# Patient Record
Sex: Male | Born: 1950 | Race: White | Hispanic: No | Marital: Married | Smoking: Never smoker
Health system: Southern US, Community
[De-identification: ages and names within clinical notes are randomized; demographics above are authoritative.]

## PROBLEM LIST (undated history)

## (undated) DIAGNOSIS — I10 Essential (primary) hypertension: Secondary | ICD-10-CM

## (undated) DIAGNOSIS — G4730 Sleep apnea, unspecified: Secondary | ICD-10-CM

## (undated) DIAGNOSIS — M1990 Unspecified osteoarthritis, unspecified site: Secondary | ICD-10-CM

## (undated) DIAGNOSIS — H269 Unspecified cataract: Secondary | ICD-10-CM

## (undated) DIAGNOSIS — S060X9A Concussion with loss of consciousness of unspecified duration, initial encounter: Secondary | ICD-10-CM

## (undated) DIAGNOSIS — Z9289 Personal history of other medical treatment: Secondary | ICD-10-CM

## (undated) DIAGNOSIS — E119 Type 2 diabetes mellitus without complications: Secondary | ICD-10-CM

## (undated) DIAGNOSIS — J189 Pneumonia, unspecified organism: Secondary | ICD-10-CM

## (undated) DIAGNOSIS — J45909 Unspecified asthma, uncomplicated: Secondary | ICD-10-CM

## (undated) HISTORY — PX: TONSILLECTOMY: SUR1361

## (undated) HISTORY — DX: Unspecified cataract: H26.9

## (undated) HISTORY — PX: TESTICLE REMOVAL: SHX68

## (undated) HISTORY — PX: HERNIA REPAIR: SHX51

## (undated) HISTORY — PX: EYE SURGERY: SHX253

## (undated) HISTORY — PX: IRRIGATION AND DEBRIDEMENT SEBACEOUS CYST: SHX5255

---
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
## 2004-12-07 HISTORY — PX: GASTRIC BYPASS: SHX52

## 2013-05-09 DIAGNOSIS — S060X9A Concussion with loss of consciousness of unspecified duration, initial encounter: Secondary | ICD-10-CM

## 2013-05-09 DIAGNOSIS — S060XAA Concussion with loss of consciousness status unknown, initial encounter: Secondary | ICD-10-CM

## 2013-05-09 HISTORY — DX: Concussion with loss of consciousness of unspecified duration, initial encounter: S06.0X9A

## 2013-05-09 HISTORY — DX: Concussion with loss of consciousness status unknown, initial encounter: S06.0XAA

## 2014-05-09 DIAGNOSIS — G4730 Sleep apnea, unspecified: Secondary | ICD-10-CM

## 2014-05-09 DIAGNOSIS — E119 Type 2 diabetes mellitus without complications: Secondary | ICD-10-CM

## 2014-05-09 DIAGNOSIS — Z9289 Personal history of other medical treatment: Secondary | ICD-10-CM

## 2014-05-09 HISTORY — DX: Sleep apnea, unspecified: G47.30

## 2014-05-09 HISTORY — DX: Type 2 diabetes mellitus without complications: E11.9

## 2014-05-09 HISTORY — DX: Personal history of other medical treatment: Z92.89

## 2015-08-13 ENCOUNTER — Other Ambulatory Visit: Payer: Self-pay | Admitting: Ophthalmology

## 2015-08-18 NOTE — H&P (Signed)
History & Physical:   DATE:   08-13-2015  NAME:  Aaron Shah, Aaron Shah     BQ:[id number]       HISTORY OF PRESENT ILLNESS: Chief Eye Complaints blurry vision diabetic eye exam  HA1C 6.8. BS 108mg % yesterday. Patient c/o vision being blurry. Patient c/o having problems with glare with cars approaching.  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        LOCATION:   BOTH EYES        QUALITY/COURSE:   Reports condition is worsening.        INTENSITY/SEVERITY:    Reports measurement ( or degree) as mild.      DURATION:   Reports the general length of symptoms to be months.      ONSET/TIMING:   Reports occurrence as   CONTEXT/WHEN:   Reports usually associated with   MODIFIERS/TREATMENTS:  Improved by              ACTIVE PROBLEMS: Exotropia   ICD10: H50.10 ICD9: 378.1 Onset: 08/10/2015 10:11 Initial Date:    Age-related nuclear cataract, bilateral   ICD10: H25.13  ICD9:  SURGERIES: Pick List - Surgeries  MEDICATIONS: Metformin (Glucophage):   500 mg tablet  SIG-  1 each    2 times a day    Lisinopril (Prinivil, Zestril):    20 mg tablet    SIG-   1 each     once a day              Gabapentin: 300 mg capsule SIG-  Dose-  Freq-    Pred Forte: acetate 1% suspension SIG-  1 DROP IN OS QID   Acular (Ketorlac Tromethamine):   0.5% solution  SIG-  1 drop in OS BID   Zymaxid: 0.5% solution SIG-  1 drop in OS BID  REVIEW OF SYSTEMS: ROS:   GEN- Constitutional: HENT: GEN - Endocrine: Reports symptoms of diabetes.    LUNGS/Respiratory:  HEART/Cardiovascular: Reports symptoms of hypertension.    ABD/Gastrointestinal:  Musculoskeletal (BJE): NEURO/Neurological: PSYCH/Psychiatric:    Is the pt oriented to time, place, person? yes Mood depressed __ normal  agitated __    TOBACCO: Smoker Status:   Never smoker   ICD10: Z87.898 ICD9: V13.89 Onset: 08/10/2015 10:05 Initial Date:  SOCIAL  HISTORY: Starter Pick List - Social History  FAMILY HISTORY: Family History - 1st Degree Relatives:  Brother alive and well.  ALLERGIES: Drug Allergies.  No Known.   Starter - Allergies - Summary:  PHYSICAL EXAMINATION: VS: BMI: 38.1.  BP: 110/68.  H: 70.00 in.  P: 66 /min.  W: 265lbs 0oz.    Va  08/10/2015 10:07    OD cc 20/40 ph NI OS cc 20/70 ph NI   EYEGLASSES:  OD: +0.75 sph OS: plano +1.25 x089  ADD: +2.50  MR  08/10/2015 10:12  OD:  +0.75 sph 20/40 OS: +0.25 +1.50 x090 20/70 ADD: +2.50  K's OD:    43.50     axis 128    44.25      axis 038 OS:    44.00     axis 030    44.75      axis 120  VF:  full in all four quadrants  Motility XT on up gaze OS   PUPILS: 69mm reactive -mg  EYELIDS & OCULAR ADNEXA    SLE: Conjunctiva  Cornea arcus, old scar OS     anterior chamber   deep and quiet    Iris brown      Lens OD anterior cortical and psc .   1-2  nuclear  sclerosis  and +3 psc OS   Vitreous posterior vitreous detachment   OU  CCT  Ta   in mmHg    OD 14           OS   14 Time  08/10/2015 10:20   Gonio   Dilation    phenylephrine 2.5% and trop 1% 08/10/2015 10:24   Fundus:  optic nerve  OD  45% cup                                                                   OS  45% cup   Macula      OD    clear                                                OS clear  Vessels  normal    Periphery  normal  ,  difficult view OS       Exam: GENERAL: Appearance: HEAD, EARS, NOSE AND THROAT: Ears-Nose (external) Inspection: Externally, nose and ears are normal in appearance and without scars, lesions, or nodules.      Hearing assessment shows no problems with normal conversation.      LUNGS and RESPIRATORY: Lung auscultation elicits no wheezing, rhonci, rales or rubs and with equal breath sounds.    Respiratory effort described as breathing is unlabored and chest movement is symmetrical.    HEART (Cardiovascular): Heart auscultation discovers regular  rate and rhythm; no murmur, gallop or rub. Normal heart sounds.    ABDOMEN (Gastrointestinal): Mass/Tenderness Exam: Neither are present.     MUSCULOSKELETAL (BJE): Inspection-Palpation: No major bone, joint, tendon, or muscle changes.      NEUROLOGICAL: Alert and oriented. No major deficits of coordination or sensation.      PSYCHIATRIC: Insight and judgment appear  both to be intact and appropriate.    Mood and affect are described as normal mood and full affect.    SKIN: Skin Inspection: No rashes or lesions  ADMITTING DIAGNOSIS: Age-related nuclear cataract, bilateral   ICD10: H25.13 Exotropia   ICD10: H50.10 ICD9: 378.1 Onset: 08/10/2015 10:11 Initial Date:      SURGICAL TREATMENT PLAN: APRI L 11, 2017 START KETORLAC (GREY TOP) IN THE LEFT EYE ONCE AT BREAKFAST AND ONCE AT SUPER  START ZYMAXID (TAN TOP) IN THE LEFT EYE ONCE AT BREAKFAST AND ONCE AT BEDTIME  PLEASE WAIT 5-10 MINUTES IN BETWEEN DROPS   phaco emulsion cataract extraction  w  intraocular lens implant  OS  Risk and benefits of surgery have been reviewed with the patient and the patient agrees to proceed with the surgical procedure.       Actions:     Handouts: New Handout, What is a cataract?.    ___________________________ Marylynn Pearson, Jr. concussion 21/2 yrs ago glass in OS yrs ago Starter -  Inactive Problems:

## 2015-08-18 NOTE — Progress Notes (Signed)
I was unable to reach patient by phone.  I left  A message on voice mail.  I instructed the patient to arrive at [facility] entrance at 9:15 , nothing to eat or drink after midnight.   I instructed the patient to not take any medications in am. I asked patient to not wear any lotions, powders, cologne, jewelry, piercing, make-up or nail polish.  I asked the patient to call [phone], in the am if there were any questions or problems.  I instructed patient to check CBG to check CBG and if it is less than 70 to treat it with Glucose Gel, Glucose tablets or 1/2 cup of clear juice like apple juice or cranberry juice, or 1/2 cup of regular soda. (not cream soda). I instructed patient to recheck CBG in 15 minutes and if CBG is not greater than 70, to  Call [phone] (pre- op). If it is before pre-op opens to retreat as before and recheck CBG in 15 minutes. I told patient to make note of time that liquid is taken and amount, that surgical time may have to be adjusted.

## 2015-08-19 ENCOUNTER — Encounter (HOSPITAL_COMMUNITY)
Admission: RE | Disposition: A | Discharge status: Home / Self Care | Payer: Self-pay | Source: Ambulatory Visit | Attending: Ophthalmology

## 2015-08-19 ENCOUNTER — Ambulatory Visit (HOSPITAL_COMMUNITY): Payer: No Typology Code available for payment source | Admitting: Anesthesiology

## 2015-08-19 ENCOUNTER — Encounter (HOSPITAL_COMMUNITY): Payer: Self-pay

## 2015-08-19 ENCOUNTER — Ambulatory Visit (HOSPITAL_COMMUNITY)
Admission: RE | Admit: 2015-08-19 | Discharge: 2015-08-19 | Disposition: A | Discharge status: Home / Self Care | Payer: No Typology Code available for payment source | Source: Ambulatory Visit | Attending: Ophthalmology | Admitting: Ophthalmology

## 2015-08-19 DIAGNOSIS — I10 Essential (primary) hypertension: Secondary | ICD-10-CM | POA: Insufficient documentation

## 2015-08-19 DIAGNOSIS — H5010 Unspecified exotropia: Secondary | ICD-10-CM | POA: Diagnosis not present

## 2015-08-19 DIAGNOSIS — Z7984 Long term (current) use of oral hypoglycemic drugs: Secondary | ICD-10-CM | POA: Diagnosis not present

## 2015-08-19 DIAGNOSIS — E119 Type 2 diabetes mellitus without complications: Secondary | ICD-10-CM | POA: Insufficient documentation

## 2015-08-19 DIAGNOSIS — H269 Unspecified cataract: Secondary | ICD-10-CM | POA: Diagnosis present

## 2015-08-19 DIAGNOSIS — Z79899 Other long term (current) drug therapy: Secondary | ICD-10-CM | POA: Diagnosis not present

## 2015-08-19 DIAGNOSIS — H2513 Age-related nuclear cataract, bilateral: Secondary | ICD-10-CM | POA: Diagnosis not present

## 2015-08-19 HISTORY — DX: Essential (primary) hypertension: I10

## 2015-08-19 HISTORY — DX: Type 2 diabetes mellitus without complications: E11.9

## 2015-08-19 HISTORY — DX: Pneumonia, unspecified organism: J18.9

## 2015-08-19 HISTORY — PX: CATARACT EXTRACTION W/PHACO: SHX586

## 2015-08-19 HISTORY — DX: Unspecified osteoarthritis, unspecified site: M19.90

## 2015-08-19 HISTORY — DX: Unspecified asthma, uncomplicated: J45.909

## 2015-08-19 HISTORY — DX: Personal history of other medical treatment: Z92.89

## 2015-08-19 HISTORY — DX: Concussion with loss of consciousness of unspecified duration, initial encounter: S06.0X9A

## 2015-08-19 HISTORY — DX: Sleep apnea, unspecified: G47.30

## 2015-08-19 LAB — GLUCOSE, CAPILLARY
GLUCOSE-CAPILLARY: 127 mg/dL — AB (ref 65–99)
Glucose-Capillary: 113 mg/dL — ABNORMAL HIGH (ref 65–99)

## 2015-08-19 LAB — CBC
HCT: 41.8 % (ref 39.0–52.0)
HEMOGLOBIN: 13.8 g/dL (ref 13.0–17.0)
MCH: 28.4 pg (ref 26.0–34.0)
MCHC: 33 g/dL (ref 30.0–36.0)
MCV: 86 fL (ref 78.0–100.0)
Platelets: 250 10*3/uL (ref 150–400)
RBC: 4.86 MIL/uL (ref 4.22–5.81)
RDW: 15.2 % (ref 11.5–15.5)
WBC: 8.3 10*3/uL (ref 4.0–10.5)

## 2015-08-19 LAB — BASIC METABOLIC PANEL
Anion gap: 10 (ref 5–15)
BUN: 18 mg/dL (ref 6–20)
CHLORIDE: 109 mmol/L (ref 101–111)
CO2: 21 mmol/L — AB (ref 22–32)
CREATININE: 1.09 mg/dL (ref 0.61–1.24)
Calcium: 8.9 mg/dL (ref 8.9–10.3)
GFR calc Af Amer: >60 mL/min (ref >60)
GFR calc non Af Amer: >60 mL/min (ref >60)
GLUCOSE: 141 mg/dL — AB (ref 65–99)
Potassium: 4.1 mmol/L (ref 3.5–5.1)
SODIUM: 140 mmol/L (ref 135–145)

## 2015-08-19 SURGERY — PHACOEMULSIFICATION, CATARACT, WITH IOL INSERTION
Anesthesia: Monitor Anesthesia Care | Site: Eye | Laterality: Left

## 2015-08-19 MED ORDER — GENTAMICIN SULFATE 40 MG/ML IJ SOLN
INTRAMUSCULAR | Status: AC
  Filled 2015-08-19: qty 2

## 2015-08-19 MED ORDER — PHENYLEPHRINE HCL 2.5 % OP SOLN
1.0000 [drp] | OPHTHALMIC | Status: AC
  Administered 2015-08-19 (×3): 1 [drp] via OPHTHALMIC
  Filled 2015-08-19: qty 2

## 2015-08-19 MED ORDER — MIDAZOLAM HCL 5 MG/5ML IJ SOLN
INTRAMUSCULAR | Status: DC | PRN
  Administered 2015-08-19: 2 mg via INTRAVENOUS

## 2015-08-19 MED ORDER — EPINEPHRINE HCL 1 MG/ML IJ SOLN
INTRAMUSCULAR | Status: AC
  Filled 2015-08-19: qty 1

## 2015-08-19 MED ORDER — BSS IO SOLN
INTRAOCULAR | Status: AC
  Filled 2015-08-19: qty 500

## 2015-08-19 MED ORDER — SODIUM HYALURONATE 10 MG/ML IO SOLN
INTRAOCULAR | Status: DC | PRN
  Administered 2015-08-19: 0.85 mL via INTRAOCULAR

## 2015-08-19 MED ORDER — GATIFLOXACIN 0.5 % OP SOLN
1.0000 [drp] | OPHTHALMIC | Status: AC
  Administered 2015-08-19 (×3): 1 [drp] via OPHTHALMIC
  Filled 2015-08-19: qty 2.5

## 2015-08-19 MED ORDER — PROPOFOL 10 MG/ML IV BOLUS
INTRAVENOUS | Status: DC | PRN
  Administered 2015-08-19: 100 mg via INTRAVENOUS

## 2015-08-19 MED ORDER — LIDOCAINE HCL 2 % IJ SOLN
INTRAMUSCULAR | Status: DC | PRN
  Administered 2015-08-19: 5 mL via RETROBULBAR

## 2015-08-19 MED ORDER — PROPOFOL 10 MG/ML IV BOLUS
INTRAVENOUS | Status: AC
  Filled 2015-08-19: qty 20

## 2015-08-19 MED ORDER — NA CHONDROIT SULF-NA HYALURON 40-30 MG/ML IO SOLN
INTRAOCULAR | Status: DC | PRN
  Administered 2015-08-19: 1 mL via INTRAOCULAR

## 2015-08-19 MED ORDER — MIDAZOLAM HCL 2 MG/2ML IJ SOLN
INTRAMUSCULAR | Status: AC
  Filled 2015-08-19: qty 2

## 2015-08-19 MED ORDER — BSS IO SOLN
INTRAOCULAR | Status: DC | PRN
  Administered 2015-08-19: .3 mL

## 2015-08-19 MED ORDER — TETRACAINE HCL 0.5 % OP SOLN
1.0000 [drp] | OPHTHALMIC | Status: DC
  Filled 2015-08-19: qty 2

## 2015-08-19 MED ORDER — ACETAMINOPHEN 325 MG PO TABS
ORAL_TABLET | ORAL | Status: AC
  Filled 2015-08-19: qty 2

## 2015-08-19 MED ORDER — SODIUM CHLORIDE 0.9 % IV SOLN
INTRAVENOUS | Status: DC
  Administered 2015-08-19: 11:00:00 via INTRAVENOUS

## 2015-08-19 MED ORDER — TOBRAMYCIN-DEXAMETHASONE 0.3-0.1 % OP OINT
TOPICAL_OINTMENT | OPHTHALMIC | Status: AC
  Filled 2015-08-19: qty 3.5

## 2015-08-19 MED ORDER — LIDOCAINE HCL (CARDIAC) 20 MG/ML IV SOLN
INTRAVENOUS | Status: AC
  Filled 2015-08-19: qty 5

## 2015-08-19 MED ORDER — SODIUM HYALURONATE 10 MG/ML IO SOLN
INTRAOCULAR | Status: AC
  Filled 2015-08-19: qty 0.85

## 2015-08-19 MED ORDER — 0.9 % SODIUM CHLORIDE (POUR BTL) OPTIME
TOPICAL | Status: DC | PRN
  Administered 2015-08-19: 1000 mL

## 2015-08-19 MED ORDER — SODIUM CHLORIDE 0.9 % IV SOLN
INTRAVENOUS | Status: DC | PRN
  Administered 2015-08-19: 11:00:00 via INTRAVENOUS

## 2015-08-19 MED ORDER — ACETYLCHOLINE CHLORIDE 20 MG IO SOLR
INTRAOCULAR | Status: DC | PRN
  Administered 2015-08-19: 5 mg via INTRAOCULAR

## 2015-08-19 MED ORDER — LIDOCAINE-EPINEPHRINE 2 %-1:100000 IJ SOLN
INTRAMUSCULAR | Status: DC | PRN
  Administered 2015-08-19: 5 mL via RETROBULBAR

## 2015-08-19 MED ORDER — NA CHONDROIT SULF-NA HYALURON 40-30 MG/ML IO SOLN
INTRAOCULAR | Status: AC
  Filled 2015-08-19: qty 0.5

## 2015-08-19 MED ORDER — LIDOCAINE-EPINEPHRINE 2 %-1:100000 IJ SOLN
INTRAMUSCULAR | Status: AC
  Filled 2015-08-19: qty 1

## 2015-08-19 MED ORDER — KETOROLAC TROMETHAMINE 0.5 % OP SOLN
1.0000 [drp] | OPHTHALMIC | Status: AC
  Administered 2015-08-19 (×3): 1 [drp] via OPHTHALMIC
  Filled 2015-08-19: qty 5

## 2015-08-19 MED ORDER — LIDOCAINE HCL (CARDIAC) 20 MG/ML IV SOLN
INTRAVENOUS | Status: DC | PRN
  Administered 2015-08-19: 60 mg via INTRAVENOUS

## 2015-08-19 MED ORDER — TROPICAMIDE 1 % OP SOLN
1.0000 [drp] | OPHTHALMIC | Status: AC
  Administered 2015-08-19 (×3): 1 [drp] via OPHTHALMIC
  Filled 2015-08-19: qty 3

## 2015-08-19 MED ORDER — EPINEPHRINE HCL 1 MG/ML IJ SOLN
INTRAOCULAR | Status: DC | PRN
  Administered 2015-08-19: .3 mL

## 2015-08-19 MED ORDER — CYCLOPENTOLATE HCL 1 % OP SOLN
1.0000 [drp] | OPHTHALMIC | Status: AC
  Administered 2015-08-19 (×3): 1 [drp] via OPHTHALMIC
  Filled 2015-08-19: qty 2

## 2015-08-19 MED ORDER — ACETYLCHOLINE CHLORIDE 20 MG IO SOLR
INTRAOCULAR | Status: AC
  Filled 2015-08-19: qty 1

## 2015-08-19 MED ORDER — DEXAMETHASONE SODIUM PHOSPHATE 10 MG/ML IJ SOLN
INTRAMUSCULAR | Status: AC
  Filled 2015-08-19: qty 1

## 2015-08-19 MED ORDER — TOBRAMYCIN 0.3 % OP OINT
TOPICAL_OINTMENT | OPHTHALMIC | Status: DC | PRN
  Administered 2015-08-19: 1 via OPHTHALMIC

## 2015-08-19 MED ORDER — ACETAMINOPHEN 325 MG PO TABS
650.0000 mg | ORAL_TABLET | ORAL | Status: DC | PRN
  Administered 2015-08-19: 650 mg via ORAL

## 2015-08-19 MED ORDER — PILOCARPINE HCL 4 % OP SOLN
OPHTHALMIC | Status: AC
  Filled 2015-08-19: qty 15

## 2015-08-19 MED ORDER — LIDOCAINE HCL (PF) 0.5 % IJ SOLN
INTRAMUSCULAR | Status: DC | PRN
  Administered 2015-08-19: 1 mL

## 2015-08-19 MED ORDER — TETRACAINE HCL 0.5 % OP SOLN
OPHTHALMIC | Status: AC
  Filled 2015-08-19: qty 2

## 2015-08-19 MED ORDER — BUPIVACAINE HCL (PF) 0.75 % IJ SOLN
INTRAMUSCULAR | Status: AC
  Filled 2015-08-19: qty 10

## 2015-08-19 MED ORDER — LIDOCAINE HCL 2 % IJ SOLN
INTRAMUSCULAR | Status: AC
  Filled 2015-08-19: qty 20

## 2015-08-19 MED ORDER — BSS IO SOLN
INTRAOCULAR | Status: AC
  Filled 2015-08-19: qty 15

## 2015-08-19 SURGICAL SUPPLY — 32 items
APPLICATOR COTTON TIP 6IN STRL (MISCELLANEOUS) ×2 IMPLANT
APPLICATOR DR MATTHEWS STRL (MISCELLANEOUS) ×2 IMPLANT
BLADE KERATOME 2.75 (BLADE) ×2 IMPLANT
CANNULA ANTERIOR CHAMBER 27GA (MISCELLANEOUS) ×2 IMPLANT
CORDS BIPOLAR (ELECTRODE) IMPLANT
COVER MAYO STAND STRL (DRAPES) ×2 IMPLANT
DRAPE OPHTHALMIC 40X48 W POUCH (DRAPES) ×2 IMPLANT
DRAPE RETRACTOR (MISCELLANEOUS) ×2 IMPLANT
GLOVE BIO SURGEON STRL SZ8 (GLOVE) ×2 IMPLANT
GLOVE SS BIOGEL STRL SZ 8 (GLOVE) ×1 IMPLANT
GLOVE SUPERSENSE BIOGEL SZ 8 (GLOVE) ×1
GOWN STRL REUS W/ TWL LRG LVL3 (GOWN DISPOSABLE) ×2 IMPLANT
GOWN STRL REUS W/TWL LRG LVL3 (GOWN DISPOSABLE) ×2
KIT BASIN OR (CUSTOM PROCEDURE TRAY) ×2 IMPLANT
KIT ROOM TURNOVER OR (KITS) ×2 IMPLANT
LENS IOL ACRSF IQ PC 20.5 (Intraocular Lens) ×1 IMPLANT
LENS IOL ACRYSOF IQ POST 20.5 (Intraocular Lens) ×2 IMPLANT
NEEDLE 18GX1X1/2 (RX/OR ONLY) (NEEDLE) ×2 IMPLANT
NEEDLE 25GX 5/8IN NON SAFETY (NEEDLE) ×2 IMPLANT
NEEDLE FILTER BLUNT 18X 1/2SAF (NEEDLE) ×1
NEEDLE FILTER BLUNT 18X1 1/2 (NEEDLE) ×1 IMPLANT
NS IRRIG 1000ML POUR BTL (IV SOLUTION) ×4 IMPLANT
PACK CATARACT CUSTOM (CUSTOM PROCEDURE TRAY) ×2 IMPLANT
PAD ARMBOARD 7.5X6 YLW CONV (MISCELLANEOUS) ×2 IMPLANT
PAK PIK CVS CATARACT (OPHTHALMIC) ×2 IMPLANT
SUT ETHILON 10 0 CS140 6 (SUTURE) ×2 IMPLANT
SUT SILK 6 0 G 6 (SUTURE) IMPLANT
SYR TB 1ML LUER SLIP (SYRINGE) ×2 IMPLANT
TIP ABS 45DEG FLARED 0.9MM (TIP) ×2 IMPLANT
TOWEL OR 17X26 10 PK STRL BLUE (TOWEL DISPOSABLE) IMPLANT
WATER STERILE IRR 1000ML POUR (IV SOLUTION) ×2 IMPLANT
WIPE INSTRUMENT VISIWIPE 73X73 (MISCELLANEOUS) ×2 IMPLANT

## 2015-08-19 NOTE — Progress Notes (Signed)
   08/19/15 1026  OBSTRUCTIVE SLEEP APNEA  Have you ever been diagnosed with sleep apnea through a sleep study? Yes  If yes, do you have and use a CPAP or BPAP machine every night? 0  Do you snore loudly (loud enough to be heard through closed doors)?  1  Do you often feel tired, fatigued, or sleepy during the daytime (such as falling asleep during driving or talking to someone)? 1  Has anyone observed you stop breathing during your sleep? 1  Do you have, or are you being treated for high blood pressure? 1  BMI more than 35 kg/m2? 1  Age > 50 (1-yes) 1  Neck circumference greater than:Male 16 inches or larger, Male 17inches or larger? 1  Male Gender (Yes=1) 1  Obstructive Sleep Apnea Score 8  Score 5 or greater  Results sent to PCP

## 2015-08-19 NOTE — Interval H&P Note (Signed)
History and Physical Interval Note:  08/19/2015 11:27 AM  Aaron Shah  has presented today for surgery, with the diagnosis of Age Related Neuclear Cataracts Left Eye  The various methods of treatment have been discussed with the patient and family. After consideration of risks, benefits and other options for treatment, the patient has consented to  Procedure(s): CATARACT EXTRACTION PHACO AND INTRAOCULAR LENS PLACEMENT (IOC) LEFT EYE (Left) as a surgical intervention .  The patient's history has been reviewed, patient examined, no change in status, stable for surgery.  I have reviewed the patient's chart and labs.  Questions were answered to the patient's satisfaction.     Marrah Vanevery

## 2015-08-19 NOTE — Transfer of Care (Signed)
Immediate Anesthesia Transfer of Care Note  Patient: Aaron Shah  Procedure(s) Performed: Procedure(s): CATARACT EXTRACTION PHACO AND INTRAOCULAR LENS PLACEMENT (IOC) LEFT EYE (Left)  Patient Location: PACU  Anesthesia Type:MAC  Level of Consciousness: awake, alert , oriented and patient cooperative  Airway & Oxygen Therapy: Patient Spontanous Breathing  Post-op Assessment: Post -op Vital signs reviewed and stable, Patient moving all extremities and Patient moving all extremities X 4  Post vital signs: Reviewed and stable  Last Vitals:  Filed Vitals:   08/19/15 0956  BP: 125/63  Pulse: 69  Temp: 36.8 C  Resp: 20    Complications: No apparent anesthesia complications

## 2015-08-19 NOTE — H&P (View-Only) (Signed)
History & Physical:   DATE:   08-13-2015  NAME:  Aaron Shah, Aaron Shah     BQ:[id number]       HISTORY OF PRESENT ILLNESS: Chief Eye Complaints blurry vision diabetic eye exam  HA1C 6.8. BS 108mg % yesterday. Patient c/o vision being blurry. Patient c/o having problems with glare with cars approaching.  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        LOCATION:   BOTH EYES        QUALITY/COURSE:   Reports condition is worsening.        INTENSITY/SEVERITY:    Reports measurement ( or degree) as mild.      DURATION:   Reports the general length of symptoms to be months.      ONSET/TIMING:   Reports occurrence as   CONTEXT/WHEN:   Reports usually associated with   MODIFIERS/TREATMENTS:  Improved by              ACTIVE PROBLEMS: Exotropia   ICD10: H50.10 ICD9: 378.1 Onset: 08/10/2015 10:11 Initial Date:    Age-related nuclear cataract, bilateral   ICD10: H25.13  ICD9:  SURGERIES: Pick List - Surgeries  MEDICATIONS: Metformin (Glucophage):   500 mg tablet  SIG-  1 each    2 times a day    Lisinopril (Prinivil, Zestril):    20 mg tablet    SIG-   1 each     once a day              Gabapentin: 300 mg capsule SIG-  Dose-  Freq-    Pred Forte: acetate 1% suspension SIG-  1 DROP IN OS QID   Acular (Ketorlac Tromethamine):   0.5% solution  SIG-  1 drop in OS BID   Zymaxid: 0.5% solution SIG-  1 drop in OS BID  REVIEW OF SYSTEMS: ROS:   GEN- Constitutional: HENT: GEN - Endocrine: Reports symptoms of diabetes.    LUNGS/Respiratory:  HEART/Cardiovascular: Reports symptoms of hypertension.    ABD/Gastrointestinal:  Musculoskeletal (BJE): NEURO/Neurological: PSYCH/Psychiatric:    Is the pt oriented to time, place, person? yes Mood depressed __ normal  agitated __    TOBACCO: Smoker Status:   Never smoker   ICD10: Z87.898 ICD9: V13.89 Onset: 08/10/2015 10:05 Initial Date:  SOCIAL  HISTORY: Starter Pick List - Social History  FAMILY HISTORY: Family History - 1st Degree Relatives:  Brother alive and well.  ALLERGIES: Drug Allergies.  No Known.   Starter - Allergies - Summary:  PHYSICAL EXAMINATION: VS: BMI: 38.1.  BP: 110/68.  H: 70.00 in.  P: 66 /min.  W: 265lbs 0oz.    Va  08/10/2015 10:07    OD cc 20/40 ph NI OS cc 20/70 ph NI   EYEGLASSES:  OD: +0.75 sph OS: plano +1.25 x089  ADD: +2.50  MR  08/10/2015 10:12  OD:  +0.75 sph 20/40 OS: +0.25 +1.50 x090 20/70 ADD: +2.50  K's OD:    43.50     axis 128    44.25      axis 038 OS:    44.00     axis 030    44.75      axis 120  VF:  full in all four quadrants  Motility XT on up gaze OS   PUPILS: 40mm reactive -mg  EYELIDS & OCULAR ADNEXA    SLE: Conjunctiva  Cornea arcus, old scar OS     anterior chamber   deep and quiet    Iris brown      Lens OD anterior cortical and psc .   1-2  nuclear  sclerosis  and +3 psc OS   Vitreous posterior vitreous detachment   OU  CCT  Ta   in mmHg    OD 14           OS   14 Time  08/10/2015 10:20   Gonio   Dilation    phenylephrine 2.5% and trop 1% 08/10/2015 10:24   Fundus:  optic nerve  OD  45% cup                                                                   OS  45% cup   Macula      OD    clear                                                OS clear  Vessels  normal    Periphery  normal  ,  difficult view OS       Exam: GENERAL: Appearance: HEAD, EARS, NOSE AND THROAT: Ears-Nose (external) Inspection: Externally, nose and ears are normal in appearance and without scars, lesions, or nodules.      Hearing assessment shows no problems with normal conversation.      LUNGS and RESPIRATORY: Lung auscultation elicits no wheezing, rhonci, rales or rubs and with equal breath sounds.    Respiratory effort described as breathing is unlabored and chest movement is symmetrical.    HEART (Cardiovascular): Heart auscultation discovers regular  rate and rhythm; no murmur, gallop or rub. Normal heart sounds.    ABDOMEN (Gastrointestinal): Mass/Tenderness Exam: Neither are present.     MUSCULOSKELETAL (BJE): Inspection-Palpation: No major bone, joint, tendon, or muscle changes.      NEUROLOGICAL: Alert and oriented. No major deficits of coordination or sensation.      PSYCHIATRIC: Insight and judgment appear  both to be intact and appropriate.    Mood and affect are described as normal mood and full affect.    SKIN: Skin Inspection: No rashes or lesions  ADMITTING DIAGNOSIS: Age-related nuclear cataract, bilateral   ICD10: H25.13 Exotropia   ICD10: H50.10 ICD9: 378.1 Onset: 08/10/2015 10:11 Initial Date:      SURGICAL TREATMENT PLAN: APRI L 11, 2017 START KETORLAC (GREY TOP) IN THE LEFT EYE ONCE AT BREAKFAST AND ONCE AT SUPER  START ZYMAXID (TAN TOP) IN THE LEFT EYE ONCE AT BREAKFAST AND ONCE AT BEDTIME  PLEASE WAIT 5-10 MINUTES IN BETWEEN DROPS   phaco emulsion cataract extraction  w  intraocular lens implant  OS  Risk and benefits of surgery have been reviewed with the patient and the patient agrees to proceed with the surgical procedure.       Actions:     Handouts: New Handout, What is a cataract?.    ___________________________ Marylynn Pearson, Jr. concussion 21/2 yrs ago glass in OS yrs ago Starter -  Inactive Problems:

## 2015-08-19 NOTE — Op Note (Signed)
Preoperative diagnosis:  significant cataract left eye  Postop diagnosis: Same Procedure: Phacoemulsification with intraocular lens implant left eye Complications:: None Anesthesia: 2% Xylocaine with epinephrine in a 50-50 mixture 0.75% Marcaine with ampule Wydase Procedure: The patient is transported to the operating room where he was given a peribulbar block with the aforementioned local anesthetic agent. The patient's face was then prepped and draped in the usual sterile fashion. With the surgeon sitting temporally the operating microscope in position a Weck-Cel sponge was used to fixate the globe and a 15 blade was used to enter through inferior clear cornea Viscoat was injected in the anterior chamber and using an additional Weck-Cel sponge a 2.75 mm keratome blade was used to enter the temporal cornea. A bent 25-gauge needle was used to incise anterior capsule and a continuous tear curvilinear capsulorrhexis was formed BSS was used to hydrodissect the hydrodelineate the nucleus. The phacoemulsification unit was then used to remove the epinucleus and sculpt the nucleus centrally using the phacotip and the snapper Hook the nucleus was divided into 4 quadrants to fragments removed from the eye easily however there was a large remaining nuclear plate that required Viscoat dissection. It took several attempts to release the nuclear plate from the capsular bag eventually it elevated and was removed with the phacoemulsification tip. There was a very thick remaining epinucleus and cortical material that was stripped from the posterior capsule using the irrigation aspiration handpiece. The posterior capsule remained intact therefore Provisc was injected in the eye the intraocular lens implant was examined and noted to have no defects. The lens was an Alcon AcrySof SN 60 WF IQ lens 20.5 diopter. SN [id number] 0.061 the lens is placed in lens injector and injected and positioned with a Kuglen hook the  irrigation-aspiration device was used to remove viscoelastic from the eye the eye was pressurized and a single 10-0 nylon suture was placed to achieve watertight closure. Topical Tobradex ointment was applied to the eye an eye shield was placed cost the patient had excellent motility at the end of the case. Marylynn Pearson Junior M.D.

## 2015-08-19 NOTE — Anesthesia Postprocedure Evaluation (Signed)
Anesthesia Post Note  Patient: Aaron Shah  Procedure(s) Performed: Procedure(s) (LRB): CATARACT EXTRACTION PHACO AND INTRAOCULAR LENS PLACEMENT (IOC) LEFT EYE (Left)  Patient location during evaluation: PACU Anesthesia Type: MAC Level of consciousness: awake, awake and alert, oriented and patient cooperative Pain management: pain level controlled Vital Signs Assessment: post-procedure vital signs reviewed and stable Respiratory status: spontaneous breathing and respiratory function stable Cardiovascular status: blood pressure returned to baseline and stable Anesthetic complications: no    Last Vitals:  Filed Vitals:   08/19/15 1318 08/19/15 1327  BP: 146/70 140/65  Pulse: 67 59  Temp: 36.7 C   Resp: 12 18    Last Pain: There were no vitals filed for this visit.               Airel Magadan EDWARD

## 2015-08-19 NOTE — Anesthesia Procedure Notes (Signed)
Procedure Name: MAC Date/Time: 08/19/2015 11:52 AM Performed by: Izora Gala Pre-anesthesia Checklist: Patient identified, Emergency Drugs available, Suction available and Patient being monitored Patient Re-evaluated:Patient Re-evaluated prior to inductionOxygen Delivery Method: Nasal cannula Intubation Type: IV induction Placement Confirmation: positive ETCO2

## 2015-08-19 NOTE — Anesthesia Preprocedure Evaluation (Addendum)
Anesthesia Evaluation  Patient identified by MRN, date of birth, ID band Patient awake    History of Anesthesia Complications Negative for: history of anesthetic complications  Airway Mallampati: II  TM Distance: >3 FB     Dental  (+) Teeth Intact   Pulmonary sleep apnea ,    Pulmonary exam normal        Cardiovascular hypertension, Pt. on medications  Rhythm:Regular Rate:Normal     Neuro/Psych negative neurological ROS  negative psych ROS   GI/Hepatic negative GI ROS, Neg liver ROS,   Endo/Other  diabetes, Well Controlled, Type 2, Oral Hypoglycemic Agents  Renal/GU negative Renal ROS     Musculoskeletal   Abdominal Normal abdominal exam  (+)   Peds  Hematology   Anesthesia Other Findings   Reproductive/Obstetrics                            Anesthesia Physical Anesthesia Plan  ASA: II  Anesthesia Plan: MAC   Post-op Pain Management:    Induction: Intravenous  Airway Management Planned: Nasal Cannula and Natural Airway  Additional Equipment:   Intra-op Plan:   Post-operative Plan:   Informed Consent:   Plan Discussed with: CRNA, Anesthesiologist and Surgeon  Anesthesia Plan Comments:         Anesthesia Quick Evaluation

## 2015-08-19 NOTE — Discharge Instructions (Signed)
The patient should sleep with the plastic eye sheild on his eye. He should avoid heavy lifting bending or straining and do not rub the eye. He may use the eye sheild or his eyeglasses or sunglasses at all times. Sleep on back or right side.

## 2015-08-20 ENCOUNTER — Encounter (HOSPITAL_COMMUNITY): Payer: Self-pay | Admitting: Ophthalmology

## 2017-02-07 ENCOUNTER — Encounter: Payer: Self-pay | Admitting: Physician Assistant

## 2017-02-07 ENCOUNTER — Ambulatory Visit (INDEPENDENT_AMBULATORY_CARE_PROVIDER_SITE_OTHER): Payer: Medicare Other | Admitting: Physician Assistant

## 2017-02-07 VITALS — BP 110/60 | HR 51 | Temp 98.2°F | Resp 14 | Ht 70.0 in | Wt 275.0 lb

## 2017-02-07 DIAGNOSIS — Z9884 Bariatric surgery status: Secondary | ICD-10-CM | POA: Diagnosis not present

## 2017-02-07 DIAGNOSIS — G4733 Obstructive sleep apnea (adult) (pediatric): Secondary | ICD-10-CM

## 2017-02-07 DIAGNOSIS — E119 Type 2 diabetes mellitus without complications: Secondary | ICD-10-CM

## 2017-02-07 DIAGNOSIS — E669 Obesity, unspecified: Secondary | ICD-10-CM

## 2017-02-07 DIAGNOSIS — E291 Testicular hypofunction: Secondary | ICD-10-CM | POA: Diagnosis not present

## 2017-02-07 DIAGNOSIS — R079 Chest pain, unspecified: Secondary | ICD-10-CM | POA: Insufficient documentation

## 2017-02-07 LAB — COMPREHENSIVE METABOLIC PANEL
ALBUMIN: 4 g/dL (ref 3.5–5.2)
ALT: 18 U/L (ref 0–53)
AST: 16 U/L (ref 0–37)
Alkaline Phosphatase: 57 U/L (ref 39–117)
BILIRUBIN TOTAL: 0.4 mg/dL (ref 0.2–1.2)
BUN: 20 mg/dL (ref 6–23)
CALCIUM: 9.2 mg/dL (ref 8.4–10.5)
CO2: 25 meq/L (ref 19–32)
CREATININE: 1.15 mg/dL (ref 0.40–1.50)
Chloride: 108 mEq/L (ref 96–112)
GFR: 68.01 mL/min (ref >60.00)
Glucose, Bld: 115 mg/dL — ABNORMAL HIGH (ref 70–99)
Potassium: 4.6 mEq/L (ref 3.5–5.1)
Sodium: 140 mEq/L (ref 135–145)
Total Protein: 6.5 g/dL (ref 6.0–8.3)

## 2017-02-07 LAB — LIPID PANEL
CHOL/HDL RATIO: 3
CHOLESTEROL: 130 mg/dL (ref 0–200)
HDL: 41.4 mg/dL (ref >39.00)
LDL Cholesterol: 73 mg/dL (ref 0–99)
NonHDL: 88.63
TRIGLYCERIDES: 78 mg/dL (ref 0.0–149.0)
VLDL: 15.6 mg/dL (ref 0.0–40.0)

## 2017-02-07 LAB — HEMOGLOBIN A1C: Hgb A1c MFr Bld: 7.5 % — ABNORMAL HIGH (ref 4.6–6.5)

## 2017-02-07 LAB — MAGNESIUM: MAGNESIUM: 1.9 mg/dL (ref 1.5–2.5)

## 2017-02-07 LAB — TSH: TSH: 0.97 u[IU]/mL (ref 0.35–4.50)

## 2017-02-07 MED ORDER — NITROGLYCERIN 0.4 MG SL SUBL: 0.4000 mg | SUBLINGUAL_TABLET | SUBLINGUAL | 3 refills | Status: DC | PRN

## 2017-02-07 NOTE — Assessment & Plan Note (Signed)
No labs in about a year per patient. Home fasting sugars at a good range. Will repeat labs today. Will check records regarding eye exam, foot exam and pneumonia immunizations. Will repeat labs today.

## 2017-02-07 NOTE — Assessment & Plan Note (Signed)
On CPAP therapy. Patient to follow-up with specialist as scheduled. BP stable today. Discussed OTC measures for dry mouth.

## 2017-02-07 NOTE — Assessment & Plan Note (Signed)
Also dyspnea on exertion. EKG with sinus bradycardia and signs of LVH, unchanged compared to EKG from 08/2015. Concern for stable angina. BP stable. Will check A1C and lipids today. Urgent referral placed to Cardiology for assessment, stress testing, etc. Rx Nitro given with instructions for use. Strict ER precautions reviewed with patient.   Follow-up scheduled.

## 2017-02-07 NOTE — Patient Instructions (Signed)
Please go to the lab for blood work. I will call with your results.  Please avoid physically strenuous activities until we get stress test.  Continue chronic medications as directed. I will alter these based on lab results. I have sent in a script for the Nitroglycerin to use if there is any chest pain at rest (or with exertion that is not resolved with rest). If you have to take one of these, call [phone].   We will schedule follow-up based on lab results.

## 2017-02-07 NOTE — Assessment & Plan Note (Signed)
Managed by Endocrinology. Has appointment scheduled for next week.

## 2017-02-07 NOTE — Assessment & Plan Note (Addendum)
Body mass index is 39.46 kg/m. + OSA. Vitals stable. Discussed diet recommendations. Holding off on exercise regimen until Cardiac assessment.

## 2017-02-07 NOTE — Progress Notes (Signed)
Pre visit review using our clinic review tool, if applicable. No additional management support is needed unless otherwise documented below in the visit note. 

## 2017-02-07 NOTE — Progress Notes (Signed)
Patient presents to clinic today to establish care.  Acute Concerns: Endorses long-standing history of SOBOE. + history of bronchitis and pneumonia. + history of asthma. Denies wheezing or chest tightness but notes SOBOE with climbing stairs. Also notes some l-sided chest discomfort during this that resolves with rest. Patient denies + history of stress test a few years ago. Normal per patient. States he had some abnormal lung testing in the past but nothing was changed with his regimen. Only uses albuterol when sick. Otherwise not needed. Denies palpitations but notes some lightheadedness during exertion. Denies syncope.   Chronic Issues: (1) Diabetes Mellitus II -- Long-standing history. Is currently on Metformin 500 mg twice daily. Is also on Lisinopril 5 mg daily for renovascular protection. Is taking both medications as directed. Is checking fasting sugars at home, averaging 120-150.  Has not had labs in quite some time.  Endorses intermittent cramping of hands and legs.   (2) OSA -- per patient. Endorses CPAP nightly. Endorses decent rest but notes significant dry mouth in the morning. Is not using anything for dry mouth.  (3) Hypogonadism -- Patient followed by Endocrinology. Is currently on a regimen of depotestosterone every 2 weeks. Has follow-up next week for repeat labs.  Health Maintenance: Immunizations -- need records from [place]. Patient unsure of record. Colonoscopy -- Has had before but cannot remember when. [facility] Will need to get records.  Past Medical History:  Diagnosis Date  . Arthritis    entire spine, C4,5,6- HNP, seeing MD at [facility]   . Asthma    h/o- uses Proventil when he has a flare   . Cataract   . Concussion 05/2013   resulted from a fall, had been rx for depression after that but developed feelings of homicide & suicide, med. was d/c'd  . Diabetes mellitus without complication ([place]) 0626   oral med.   . H/O exercise stress test 2016   pt,.  reports that it was done thru the [place] in [place]  . Hypertension   . Pneumonia 1970's   hosp. in [employer], pt. reports that he is prone to getting bronchitis   . Sleep apnea 2016   CPAP machine but not using at present, test done in [place] , will have a new study here in [place]     Past Surgical History:  Procedure Laterality Date  . CATARACT EXTRACTION W/PHACO Left 08/19/2015   Procedure: CATARACT EXTRACTION PHACO AND INTRAOCULAR LENS PLACEMENT (IOC) LEFT EYE;  Surgeon: Marylynn Pearson, MD;  Location: [place];  Service: Ophthalmology;  Laterality: Left;  . EYE SURGERY     glass removed fr. R eye   . GASTRIC BYPASS  12/2004   [place], [place], weight was 400lbs. prior to   . HERNIA REPAIR     inguinal- L   . IRRIGATION AND DEBRIDEMENT SEBACEOUS CYST Right    arm- upper, cysts both removed  . TESTICLE REMOVAL  1960's, 2001   both removed   . TONSILLECTOMY      Current Outpatient Prescriptions on File Prior to Visit  Medication Sig Dispense Refill  . cyclobenzaprine (FLEXERIL) 10 MG tablet Take 10 mg by mouth 3 (three) times daily as needed.    Marland Kitchen ibuprofen (ADVIL,MOTRIN) 400 MG tablet Take 400 mg by mouth every 6 (six) hours as needed for mild pain.    . Multiple Vitamin (MULTIVITAMIN WITH MINERALS) TABS tablet Take 1 tablet by mouth daily.    Marland Kitchen pyridOXINE (VITAMIN B-6)  100 MG tablet Take 100 mg by mouth daily.    Marland Kitchen testosterone cypionate (DEPOTESTOSTERONE CYPIONATE) 200 MG/ML injection Inject into the muscle every 14 (fourteen) days. 9ml. q 2 weeks    . vitamin B-12 (CYANOCOBALAMIN) 1000 MCG tablet Take 1,000 mcg by mouth daily.     No current facility-administered medications on file prior to visit.     No Known Allergies  Family History  Problem Relation Age of Onset  . Cancer Mother        Lung  . Cancer Father        Kidney, Lung  . Emphysema Father   . COPD Father   . Cancer Sister        Skin   . Cancer Paternal Grandfather        Intestinal      Social History   Social History  . Marital status: Married    Spouse name: N/A  . Number of children: N/A  . Years of education: N/A   Occupational History  . Not on file.   Social History Main Topics  . Smoking status: Never Smoker  . Smokeless tobacco: Never Used  . Alcohol use Yes     Comment: rare use of beer  . Drug use: No  . Sexual activity: Not on file   Other Topics Concern  . Not on file   Social History Narrative  . No narrative on file   Review of Systems  Constitutional: Negative for fever and malaise/fatigue.  Eyes: Negative for blurred vision and double vision.  Respiratory: Positive for shortness of breath. Negative for cough.   Cardiovascular: Positive for chest pain. Negative for palpitations.  Gastrointestinal: Negative for abdominal pain, diarrhea, heartburn, nausea and vomiting.  Neurological: Negative for dizziness and loss of consciousness.  Psychiatric/Behavioral: Negative for depression, hallucinations, memory loss, substance abuse and suicidal ideas. The patient is not nervous/anxious and does not have insomnia.    BP 110/60   Pulse (!) 51   Temp 98.2 F (36.8 C) (Oral)   Resp 14   Ht 5\' 10"  (1.778 m)   Wt 275 lb (124.7 kg)   SpO2 97%   BMI 39.46 kg/m   Physical Exam  Constitutional: He is oriented to person, place, and time and well-developed, well-nourished, and in no distress.  HENT:  Head: Normocephalic and atraumatic.  Right Ear: External ear normal.  Left Ear: External ear normal.  Nose: Nose normal.  Mouth/Throat: Oropharynx is clear and moist.  Eyes: Pupils are equal, round, and reactive to light. Conjunctivae are normal.  Neck: Neck supple. No thyromegaly present.  Cardiovascular: Normal rate, regular rhythm, normal heart sounds and intact distal pulses.   Pulmonary/Chest: Effort normal and breath sounds normal. No respiratory distress. He has no wheezes. He has no rales. He exhibits no tenderness.  Lymphadenopathy:     He has no cervical adenopathy.  Neurological: He is alert and oriented to person, place, and time.  Skin: Skin is warm and dry. No rash noted.  Psychiatric: Affect normal.  Vitals reviewed.  Assessment/Plan: OSA (obstructive sleep apnea) On CPAP therapy. Patient to follow-up with specialist as scheduled. BP stable today. Discussed OTC measures for dry mouth.  Hypogonadism in male Managed by Endocrinology. Has appointment scheduled for next week.   Diabetes mellitus without complication ([place]) No labs in about a year per patient. Home fasting sugars at a good range. Will repeat labs today. Will check records regarding eye exam, foot exam and pneumonia immunizations. Will repeat  labs today.  Obesity (BMI 30-39.9) Body mass index is 39.46 kg/m. + OSA. Vitals stable. Discussed diet recommendations. Holding off on exercise regimen until Cardiac assessment.   Chest pain on exertion Also dyspnea on exertion. EKG with sinus bradycardia and signs of LVH, unchanged compared to EKG from 08/2015. Concern for stable angina. BP stable. Will check A1C and lipids today. Urgent referral placed to Cardiology for assessment, stress testing, etc. Rx Nitro given with instructions for use. Strict ER precautions reviewed with patient.   Follow-up scheduled.     Leeanne Rio, PA-C

## 2017-02-09 ENCOUNTER — Ambulatory Visit (HOSPITAL_BASED_OUTPATIENT_CLINIC_OR_DEPARTMENT_OTHER)
Admission: RE | Admit: 2017-02-09 | Discharge: 2017-02-09 | Disposition: A | Discharge status: Home / Self Care | Payer: Medicare Other | Source: Ambulatory Visit | Attending: Cardiology | Admitting: Cardiology

## 2017-02-09 ENCOUNTER — Ambulatory Visit (INDEPENDENT_AMBULATORY_CARE_PROVIDER_SITE_OTHER): Payer: Medicare Other | Admitting: Cardiology

## 2017-02-09 ENCOUNTER — Encounter: Payer: Self-pay | Admitting: Cardiology

## 2017-02-09 VITALS — BP 126/66 | HR 71 | Ht 70.0 in | Wt 275.8 lb

## 2017-02-09 DIAGNOSIS — G4733 Obstructive sleep apnea (adult) (pediatric): Secondary | ICD-10-CM

## 2017-02-09 DIAGNOSIS — Z9884 Bariatric surgery status: Secondary | ICD-10-CM | POA: Diagnosis not present

## 2017-02-09 DIAGNOSIS — E119 Type 2 diabetes mellitus without complications: Secondary | ICD-10-CM | POA: Diagnosis not present

## 2017-02-09 DIAGNOSIS — R0602 Shortness of breath: Secondary | ICD-10-CM | POA: Insufficient documentation

## 2017-02-09 DIAGNOSIS — R0789 Other chest pain: Secondary | ICD-10-CM | POA: Insufficient documentation

## 2017-02-09 DIAGNOSIS — E669 Obesity, unspecified: Secondary | ICD-10-CM

## 2017-02-09 DIAGNOSIS — R0609 Other forms of dyspnea: Secondary | ICD-10-CM | POA: Insufficient documentation

## 2017-02-09 MED ORDER — NITROGLYCERIN 0.4 MG SL SUBL: 0.4000 mg | SUBLINGUAL_TABLET | SUBLINGUAL | 6 refills | Status: DC | PRN

## 2017-02-09 MED ORDER — ASPIRIN EC 81 MG PO TBEC: 81.0000 mg | DELAYED_RELEASE_TABLET | Freq: Every day | ORAL | 3 refills | Status: DC

## 2017-02-09 NOTE — Progress Notes (Signed)
Cardiology Office Note:    Date:  02/09/2017   ID:  Aaron Shah, DOB 11-Dec-1950, MRN [id number]  PCP:  Brunetta Jeans, PA-C  Cardiologist:  Jenean Lindau, MD   Referring MD: Brunetta Jeans, PA-C    ASSESSMENT:    1. OSA (obstructive sleep apnea)   2. Diabetes mellitus without complication ([place])   3. History of gastric bypass   4. Obesity (BMI 30-39.9)   5. Shortness of breath on exertion   6. Chest tightness    PLAN:    In order of problems listed above:  1. I discussed my findings with the patient extensively. His symptoms of shortness of breath and chest tightness on exertion or of significant concern given the fact that he has multiple risk factors for coronary artery disease.In view of the patient's symptoms, I discussed with the patient options for evaluation. Invasive and noninvasive options were given to the patient. I discussed stress testing and coronary angiography and left heart catheterization at length. Benefits, pros and cons of each approach were discussed at length. Patient had multiple questions which were answered to the patient's satisfaction. Patient opted for invasive evaluation and we will set up for coronary angiography and left heart catheterization. Further recommendations will be made based on the findings with coronary angiography. In the interim if the patient has any significant symptoms in hospital to the nearest emergency room. 2. Patient's blood pressure stable. Diet was discussed with dyslipidemia and diabetes mellitus and obesity and he verbalized understanding. These are followed by his primary care physician. He will be seen in follow-up appointment after the coronary angiography.   Medication Adjustments/Labs and Tests Ordered: Current medicines are reviewed at length with the patient today.  Concerns regarding medicines are outlined above.  No orders of the defined types were placed in this encounter.  No orders of the defined types  were placed in this encounter.    History of Present Illness:    Aaron Shah is a 66 y.o. male who is being seen today for the evaluation of shortness of breath and chest tightness at the request of Sadat, Sliwa, PA-C. Patient is a pleasant 66 year old male.He has past medical history of essential hypertension, diabetes mellitus. And obesity. He mentions to me that in the past couple of week she's been noticing chest tightness and shortness of breath on exertion consistently. This has been worried him and affecting his quality of life as it affects his effort tolerance. He denies any orthopnea or PND. Sometimes has chest tightness radiates to the left arm. At the time of my evaluation is alert awake oriented and in no distress.  Past Medical History:  Diagnosis Date  . Arthritis    entire spine, C4,5,6- HNP, seeing MD at [facility]   . Asthma    h/o- uses Proventil when he has a flare   . Cataract   . Concussion 05/2013   resulted from a fall, had been rx for depression after that but developed feelings of homicide & suicide, med. was d/c'd  . Diabetes mellitus without complication ([place]) 7616   oral med.   . H/O exercise stress test 2016   pt,. reports that it was done thru the [place] in [place]  . Hypertension   . Pneumonia 1970's   hosp. in [employer], pt. reports that he is prone to getting bronchitis   . Sleep apnea 2016   CPAP machine but not using at present, test done in [facility] ,  will have a new study here in [place]     Past Surgical History:  Procedure Laterality Date  . CATARACT EXTRACTION W/PHACO Left 08/19/2015   Procedure: CATARACT EXTRACTION PHACO AND INTRAOCULAR LENS PLACEMENT (IOC) LEFT EYE;  Surgeon: Marylynn Pearson, MD;  Location: [place];  Service: Ophthalmology;  Laterality: Left;  . EYE SURGERY     glass removed fr. R eye   . GASTRIC BYPASS  12/2004   [facility], [place], weight was 400lbs. prior to   . HERNIA REPAIR     inguinal- L   .  IRRIGATION AND DEBRIDEMENT SEBACEOUS CYST Right    arm- upper, cysts both removed  . TESTICLE REMOVAL  1960's, 2001   both removed   . TONSILLECTOMY      Current Medications: Current Meds  Medication Sig  . albuterol (PROVENTIL HFA;VENTOLIN HFA) 108 (90 Base) MCG/ACT inhaler Inhale into the lungs.  . cyclobenzaprine (FLEXERIL) 10 MG tablet Take 10 mg by mouth 3 (three) times daily as needed.  Marland Kitchen ibuprofen (ADVIL,MOTRIN) 400 MG tablet Take 400 mg by mouth every 6 (six) hours as needed for mild pain.  Marland Kitchen lisinopril (PRINIVIL,ZESTRIL) 10 MG tablet Take 5 mg by mouth daily.   . metFORMIN (GLUCOPHAGE) 500 MG tablet metformin 500 mg tablet  Take 1 tablet twice a day by oral route.  . Multiple Vitamin (MULTIVITAMIN WITH MINERALS) TABS tablet Take 1 tablet by mouth daily.  . nitroGLYCERIN (NITROSTAT) 0.4 MG SL tablet Place 1 tablet (0.4 mg total) under the tongue every 5 (five) minutes as needed for chest pain. Max of 3 doses.  Marland Kitchen pyridOXINE (VITAMIN B-6) 100 MG tablet Take 100 mg by mouth daily.  Marland Kitchen testosterone cypionate (DEPOTESTOSTERONE CYPIONATE) 200 MG/ML injection Inject into the muscle every 14 (fourteen) days. 52ml. q 2 weeks  . vitamin B-12 (CYANOCOBALAMIN) 1000 MCG tablet Take 1,000 mcg by mouth daily.     Allergies:   Patient has no known allergies.   Social History   Social History  . Marital status: Married    Spouse name: N/A  . Number of children: N/A  . Years of education: N/A   Social History Main Topics  . Smoking status: Never Smoker  . Smokeless tobacco: Never Used  . Alcohol use Yes     Comment: rare use of beer  . Drug use: No  . Sexual activity: Not Asked   Other Topics Concern  . None   Social History Narrative  . None     Family History: The patient's family history includes COPD in his father; Cancer in his father, mother, paternal grandfather, and sister; Emphysema in his father.  ROS:   Please see the history of present illness.    All other  systems reviewed and are negative.  EKGs/Labs/Other Studies Reviewed:    The following studies were reviewed today: I reviewed records from primary care physician, extensively. EKG reveals sinus rhythm and nonspecific ST-T changes.eft anterior fascicular block and findings suggestive of possible anterior wall myocardial infarction in the past   Recent Labs: 02/07/2017: ALT 18; BUN 20; Creatinine, Ser 1.15; Magnesium 1.9; Potassium 4.6; Sodium 140; TSH 0.97  Recent Lipid Panel    Component Value Date/Time   CHOL 130 02/07/2017 1044   TRIG 78.0 02/07/2017 1044   HDL 41.40 02/07/2017 1044   CHOLHDL 3 02/07/2017 1044   VLDL 15.6 02/07/2017 1044   LDLCALC 73 02/07/2017 1044    Physical Exam:    VS:  BP 126/66   Pulse  71   Ht 5\' 10"  (1.778 m)   Wt 275 lb 12.8 oz (125.1 kg)   SpO2 95%   BMI 39.57 kg/m     Wt Readings from Last 3 Encounters:  02/09/17 275 lb 12.8 oz (125.1 kg)  02/07/17 275 lb (124.7 kg)  08/19/15 265 lb (120.2 kg)     GEN: Patient is in no acute distress HEENT: Normal NECK: No JVD; No carotid bruits LYMPHATICS: No lymphadenopathy CARDIAC: S1 S2 regular, 2/6 systolic murmur at the apex. RESPIRATORY:  Clear to auscultation without rales, wheezing or rhonchi  ABDOMEN: Soft, non-tender, non-distended MUSCULOSKELETAL:  No edema; No deformity  SKIN: Warm and dry NEUROLOGIC:  Alert and oriented x 3 PSYCHIATRIC:  Normal affect    Signed, Jenean Lindau, MD  02/09/2017 12:14 PM    [facility]

## 2017-02-09 NOTE — Patient Instructions (Addendum)
Medication Instructions:  Your physician has recommended you make the following change in your medication:  START enteric coated aspirin daily  Labwork: Your physician recommends that you have BMP and PT/INR  Testing/Procedures: Your physician has requested that you have a cardiac catheterization. Cardiac catheterization is used to diagnose and/or treat various heart conditions. Doctors may recommend this procedure for a number of different reasons. The most common reason is to evaluate chest pain. Chest pain can be a symptom of coronary artery disease (CAD), and cardiac catheterization can show whether plaque is narrowing or blocking your heart's arteries. This procedure is also used to evaluate the valves, as well as measure the blood flow and oxygen levels in different parts of your heart. For further information please visit [email or web address]. Please follow instruction sheet, as given.  10/10 with Dr. Ellyn Hack arrive at 6:30 am  Follow-Up: To be determined after cath  Any Other Special Instructions Will Be Listed Below (If Applicable).     If you need a refill on your cardiac medications before your next appointment, please call your pharmacy.

## 2017-02-10 ENCOUNTER — Other Ambulatory Visit: Payer: Self-pay | Admitting: Physician Assistant

## 2017-02-10 DIAGNOSIS — E119 Type 2 diabetes mellitus without complications: Secondary | ICD-10-CM

## 2017-02-10 LAB — BASIC METABOLIC PANEL
BUN / CREAT RATIO: 16 (ref 10–24)
BUN: 21 mg/dL (ref 8–27)
CO2: 21 mmol/L (ref 20–29)
CREATININE: 1.28 mg/dL — AB (ref 0.76–1.27)
Calcium: 9.3 mg/dL (ref 8.6–10.2)
Chloride: 106 mmol/L (ref 96–106)
GFR calc Af Amer: 68 mL/min/{1.73_m2} (ref >59)
GFR calc non Af Amer: 59 mL/min/{1.73_m2} — ABNORMAL LOW (ref >59)
GLUCOSE: 149 mg/dL — AB (ref 65–99)
Potassium: 4.2 mmol/L (ref 3.5–5.2)
Sodium: 141 mmol/L (ref 134–144)

## 2017-02-10 LAB — PROTIME-INR
INR: 1 (ref 0.8–1.2)
Prothrombin Time: 10.2 s (ref 9.1–12.0)

## 2017-02-10 MED ORDER — METFORMIN HCL 500 MG PO TABS: ORAL_TABLET | ORAL | 3 refills | Status: DC

## 2017-02-13 ENCOUNTER — Telehealth: Payer: Self-pay

## 2017-02-13 NOTE — Telephone Encounter (Signed)
Left detailed message per DPR.  Patient contacted pre-catheterization at [facility] scheduled for:  02/15/2017 @ 1130 Verified arrival time and place:  NT @ 0900 Confirmed AM meds to be taken pre-cath with sip of water: Hold metformin for 24 hours prior to procedure Take ASA Hold lisinopril day of cath Confirmed patient has responsible person to drive home post procedure and observe patient for 24 hours:  yes Addl concerns:  Cr 1.28, repeat bmet day of cath Left this nurse name and # for call back if any questions.

## 2017-02-15 ENCOUNTER — Encounter (HOSPITAL_COMMUNITY)
Admission: RE | Disposition: A | Discharge status: Home / Self Care | Payer: Self-pay | Source: Ambulatory Visit | Attending: Cardiology

## 2017-02-15 ENCOUNTER — Ambulatory Visit (HOSPITAL_COMMUNITY)
Admission: RE | Admit: 2017-02-15 | Discharge: 2017-02-15 | Disposition: A | Discharge status: Home / Self Care | Payer: Medicare Other | Source: Ambulatory Visit | Attending: Cardiology | Admitting: Cardiology

## 2017-02-15 DIAGNOSIS — I209 Angina pectoris, unspecified: Secondary | ICD-10-CM

## 2017-02-15 DIAGNOSIS — G4733 Obstructive sleep apnea (adult) (pediatric): Secondary | ICD-10-CM | POA: Diagnosis not present

## 2017-02-15 DIAGNOSIS — R0789 Other chest pain: Secondary | ICD-10-CM | POA: Insufficient documentation

## 2017-02-15 DIAGNOSIS — Z9884 Bariatric surgery status: Secondary | ICD-10-CM | POA: Diagnosis not present

## 2017-02-15 DIAGNOSIS — Z6839 Body mass index (BMI) 39.0-39.9, adult: Secondary | ICD-10-CM | POA: Diagnosis not present

## 2017-02-15 DIAGNOSIS — J45909 Unspecified asthma, uncomplicated: Secondary | ICD-10-CM | POA: Diagnosis not present

## 2017-02-15 DIAGNOSIS — M1990 Unspecified osteoarthritis, unspecified site: Secondary | ICD-10-CM | POA: Insufficient documentation

## 2017-02-15 DIAGNOSIS — R0609 Other forms of dyspnea: Secondary | ICD-10-CM | POA: Diagnosis not present

## 2017-02-15 DIAGNOSIS — E785 Hyperlipidemia, unspecified: Secondary | ICD-10-CM | POA: Insufficient documentation

## 2017-02-15 DIAGNOSIS — Z7984 Long term (current) use of oral hypoglycemic drugs: Secondary | ICD-10-CM | POA: Insufficient documentation

## 2017-02-15 DIAGNOSIS — I10 Essential (primary) hypertension: Secondary | ICD-10-CM | POA: Insufficient documentation

## 2017-02-15 DIAGNOSIS — E669 Obesity, unspecified: Secondary | ICD-10-CM | POA: Diagnosis not present

## 2017-02-15 DIAGNOSIS — E119 Type 2 diabetes mellitus without complications: Secondary | ICD-10-CM | POA: Insufficient documentation

## 2017-02-15 HISTORY — PX: LEFT HEART CATH AND CORONARY ANGIOGRAPHY: CATH118249

## 2017-02-15 LAB — BASIC METABOLIC PANEL
Anion gap: 10 (ref 5–15)
BUN: 20 mg/dL (ref 6–20)
CO2: 22 mmol/L (ref 22–32)
CREATININE: 1.21 mg/dL (ref 0.61–1.24)
Calcium: 8.9 mg/dL (ref 8.9–10.3)
Chloride: 107 mmol/L (ref 101–111)
GFR calc Af Amer: >60 mL/min (ref >60)
Glucose, Bld: 125 mg/dL — ABNORMAL HIGH (ref 65–99)
POTASSIUM: 4.2 mmol/L (ref 3.5–5.1)
SODIUM: 139 mmol/L (ref 135–145)

## 2017-02-15 LAB — GLUCOSE, CAPILLARY
GLUCOSE-CAPILLARY: 130 mg/dL — AB (ref 65–99)
GLUCOSE-CAPILLARY: 109 mg/dL — AB (ref 65–99)

## 2017-02-15 LAB — CBC
HCT: 42.4 % (ref 39.0–52.0)
Hemoglobin: 13.4 g/dL (ref 13.0–17.0)
MCH: 25.9 pg — ABNORMAL LOW (ref 26.0–34.0)
MCHC: 31.6 g/dL (ref 30.0–36.0)
MCV: 82 fL (ref 78.0–100.0)
PLATELETS: 279 10*3/uL (ref 150–400)
RBC: 5.17 MIL/uL (ref 4.22–5.81)
RDW: 17.6 % — AB (ref 11.5–15.5)
WBC: 8.2 10*3/uL (ref 4.0–10.5)

## 2017-02-15 SURGERY — LEFT HEART CATH AND CORONARY ANGIOGRAPHY
Anesthesia: LOCAL

## 2017-02-15 MED ORDER — MIDAZOLAM HCL 2 MG/2ML IJ SOLN
INTRAMUSCULAR | Status: AC
  Filled 2017-02-15: qty 2

## 2017-02-15 MED ORDER — ASPIRIN 81 MG PO CHEW: 81.0000 mg | CHEWABLE_TABLET | ORAL | Status: DC

## 2017-02-15 MED ORDER — IOPAMIDOL (ISOVUE-370) INJECTION 76%
INTRAVENOUS | Status: DC | PRN
  Administered 2017-02-15: 95 mL via INTRA_ARTERIAL

## 2017-02-15 MED ORDER — HEPARIN (PORCINE) IN NACL 2-0.9 UNIT/ML-% IJ SOLN
INTRAMUSCULAR | Status: AC
  Filled 2017-02-15: qty 1000

## 2017-02-15 MED ORDER — SODIUM CHLORIDE 0.9 % WEIGHT BASED INFUSION
3.0000 mL/kg/h | INTRAVENOUS | Status: AC
  Administered 2017-02-15: 3 mL/kg/h via INTRAVENOUS

## 2017-02-15 MED ORDER — SODIUM CHLORIDE 0.9% FLUSH: 3.0000 mL | Freq: Two times a day (BID) | INTRAVENOUS | Status: DC

## 2017-02-15 MED ORDER — HEPARIN SODIUM (PORCINE) 1000 UNIT/ML IJ SOLN
INTRAMUSCULAR | Status: DC | PRN
  Administered 2017-02-15: 6000 [IU] via INTRAVENOUS

## 2017-02-15 MED ORDER — ACETAMINOPHEN 325 MG PO TABS: 650.0000 mg | ORAL_TABLET | ORAL | Status: DC | PRN

## 2017-02-15 MED ORDER — SODIUM CHLORIDE 0.9 % IV SOLN: INTRAVENOUS | Status: DC

## 2017-02-15 MED ORDER — LIDOCAINE HCL (PF) 1 % IJ SOLN
INTRAMUSCULAR | Status: DC | PRN
  Administered 2017-02-15: 2 mL via SUBCUTANEOUS

## 2017-02-15 MED ORDER — HEPARIN (PORCINE) IN NACL 2-0.9 UNIT/ML-% IJ SOLN
INTRAMUSCULAR | Status: AC | PRN
  Administered 2017-02-15: 1000 mL

## 2017-02-15 MED ORDER — IOPAMIDOL (ISOVUE-370) INJECTION 76%
INTRAVENOUS | Status: AC
  Filled 2017-02-15: qty 100

## 2017-02-15 MED ORDER — HEPARIN SODIUM (PORCINE) 1000 UNIT/ML IJ SOLN
INTRAMUSCULAR | Status: AC
  Filled 2017-02-15: qty 1

## 2017-02-15 MED ORDER — SODIUM CHLORIDE 0.9% FLUSH: 3.0000 mL | INTRAVENOUS | Status: DC | PRN

## 2017-02-15 MED ORDER — SODIUM CHLORIDE 0.9 % IV SOLN: 250.0000 mL | INTRAVENOUS | Status: DC | PRN

## 2017-02-15 MED ORDER — FENTANYL CITRATE (PF) 100 MCG/2ML IJ SOLN
INTRAMUSCULAR | Status: DC | PRN
  Administered 2017-02-15: 25 ug via INTRAVENOUS
  Administered 2017-02-15: 50 ug via INTRAVENOUS

## 2017-02-15 MED ORDER — VERAPAMIL HCL 2.5 MG/ML IV SOLN
INTRAVENOUS | Status: AC
  Filled 2017-02-15: qty 2

## 2017-02-15 MED ORDER — VERAPAMIL HCL 2.5 MG/ML IV SOLN
INTRAVENOUS | Status: DC | PRN
  Administered 2017-02-15: 10 mL via INTRA_ARTERIAL

## 2017-02-15 MED ORDER — FENTANYL CITRATE (PF) 100 MCG/2ML IJ SOLN
INTRAMUSCULAR | Status: AC
  Filled 2017-02-15: qty 2

## 2017-02-15 MED ORDER — MIDAZOLAM HCL 2 MG/2ML IJ SOLN
INTRAMUSCULAR | Status: DC | PRN
  Administered 2017-02-15: 2 mg via INTRAVENOUS
  Administered 2017-02-15: 1 mg via INTRAVENOUS

## 2017-02-15 MED ORDER — LIDOCAINE HCL 2 % IJ SOLN
INTRAMUSCULAR | Status: AC
  Filled 2017-02-15: qty 10

## 2017-02-15 MED ORDER — ONDANSETRON HCL 4 MG/2ML IJ SOLN: 4.0000 mg | Freq: Four times a day (QID) | INTRAMUSCULAR | Status: DC | PRN

## 2017-02-15 MED ORDER — SODIUM CHLORIDE 0.9 % WEIGHT BASED INFUSION: 1.0000 mL/kg/h | INTRAVENOUS | Status: DC

## 2017-02-15 SURGICAL SUPPLY — 11 items
CATH INFINITI 5FR ANG PIGTAIL (CATHETERS) ×2 IMPLANT
CATH OPTITORQUE TIG 4.0 5F (CATHETERS) ×2 IMPLANT
DEVICE RAD COMP TR BAND LRG (VASCULAR PRODUCTS) ×2 IMPLANT
GLIDESHEATH SLEND A-KIT 6F 22G (SHEATH) ×2 IMPLANT
GUIDEWIRE INQWIRE 1.5J.035X260 (WIRE) ×1 IMPLANT
INQWIRE 1.5J .035X260CM (WIRE) ×2
KIT HEART LEFT (KITS) ×2 IMPLANT
PACK CARDIAC CATHETERIZATION (CUSTOM PROCEDURE TRAY) ×2 IMPLANT
SYR MEDRAD MARK V 150ML (SYRINGE) ×2 IMPLANT
TRANSDUCER W/STOPCOCK (MISCELLANEOUS) ×2 IMPLANT
TUBING CIL FLEX 10 FLL-RA (TUBING) ×2 IMPLANT

## 2017-02-15 NOTE — Interval H&P Note (Signed)
History and Physical Interval Note:  02/15/2017 11:39 AM  Aaron Shah  has presented today for surgery, with the diagnosis of dyspnea on excertion, hypertension - Sx concerning for Class II-III Angina.  The various methods of treatment have been discussed with the patient and family. After consideration of risks, benefits and other options for treatment, the patient has consented to  Procedure(s): LEFT HEART CATH AND CORONARY ANGIOGRAPHY (N/A)  With possible PERCUTANEOUS CORONARY INTERVENTION as a surgical intervention .  The patient's history has been reviewed, patient examined, no change in status, stable for surgery.  I have reviewed the patient's chart and labs.  Questions were answered to the patient's satisfaction.    Cath Lab Visit (complete for each Cath Lab visit)  Clinical Evaluation Leading to the Procedure:   ACS: No.  Non-ACS:    Anginal Classification: CCS III  Anti-ischemic medical therapy: Minimal Therapy (1 class of medications)  Non-Invasive Test Results: No non-invasive testing performed  Prior CABG: No previous CABG   Aaron Shah

## 2017-02-15 NOTE — Research (Signed)
[place] Study Informed Consent   Subject Name: Aaron Shah  Subject met inclusion and exclusion criteria.  The informed consent form, study requirements and expectations were reviewed with the subject and questions and concerns were addressed prior to the signing of the consent form.  The subject verbalized understanding of the trial requirements.  The subject agreed to participate in the OPTIMIZE trial and signed the informed consent at [facility] on 02/15/2017.  The informed consent was obtained prior to performance of any protocol-specific procedures for the subject.  A copy of the signed informed consent was given to the subject and a copy was placed in the subject's medical record. The subject will only be enrolled if the angiographic criteria is met.  [name] 02/15/2017, 9:48 AM

## 2017-02-15 NOTE — H&P (View-Only) (Signed)
Cardiology Office Note:    Date:  02/09/2017   ID:  Aaron Shah, DOB 1950-12-26, MRN [id number]  PCP:  Brunetta Jeans, PA-C  Cardiologist:  Jenean Lindau, MD   Referring MD: Brunetta Jeans, PA-C    ASSESSMENT:    1. OSA (obstructive sleep apnea)   2. Diabetes mellitus without complication ([place])   3. History of gastric bypass   4. Obesity (BMI 30-39.9)   5. Shortness of breath on exertion   6. Chest tightness    PLAN:    In order of problems listed above:  1. I discussed my findings with the patient extensively. His symptoms of shortness of breath and chest tightness on exertion or of significant concern given the fact that he has multiple risk factors for coronary artery disease.In view of the patient's symptoms, I discussed with the patient options for evaluation. Invasive and noninvasive options were given to the patient. I discussed stress testing and coronary angiography and left heart catheterization at length. Benefits, pros and cons of each approach were discussed at length. Patient had multiple questions which were answered to the patient's satisfaction. Patient opted for invasive evaluation and we will set up for coronary angiography and left heart catheterization. Further recommendations will be made based on the findings with coronary angiography. In the interim if the patient has any significant symptoms in hospital to the nearest emergency room. 2. Patient's blood pressure stable. Diet was discussed with dyslipidemia and diabetes mellitus and obesity and he verbalized understanding. These are followed by his primary care physician. He will be seen in follow-up appointment after the coronary angiography.   Medication Adjustments/Labs and Tests Ordered: Current medicines are reviewed at length with the patient today.  Concerns regarding medicines are outlined above.  No orders of the defined types were placed in this encounter.  No orders of the defined types  were placed in this encounter.    History of Present Illness:    Aaron Shah is a 66 y.o. male who is being seen today for the evaluation of shortness of breath and chest tightness at the request of Leaman, Abe, PA-C. Patient is a pleasant 66 year old male.He has past medical history of essential hypertension, diabetes mellitus. And obesity. He mentions to me that in the past couple of week she's been noticing chest tightness and shortness of breath on exertion consistently. This has been worried him and affecting his quality of life as it affects his effort tolerance. He denies any orthopnea or PND. Sometimes has chest tightness radiates to the left arm. At the time of my evaluation is alert awake oriented and in no distress.  Past Medical History:  Diagnosis Date  . Arthritis    entire spine, C4,5,6- HNP, seeing MD at [facility]   . Asthma    h/o- uses Proventil when he has a flare   . Cataract   . Concussion 05/2013   resulted from a fall, had been rx for depression after that but developed feelings of homicide & suicide, med. was d/c'd  . Diabetes mellitus without complication ([place]) 4580   oral med.   . H/O exercise stress test 2016   pt,. reports that it was done thru the [place] in [place]  . Hypertension   . Pneumonia 1970's   hosp. in [employer], pt. reports that he is prone to getting bronchitis   . Sleep apnea 2016   CPAP machine but not using at present, test done in [facility] ,  will have a new study here in [place]     Past Surgical History:  Procedure Laterality Date  . CATARACT EXTRACTION W/PHACO Left 08/19/2015   Procedure: CATARACT EXTRACTION PHACO AND INTRAOCULAR LENS PLACEMENT (IOC) LEFT EYE;  Surgeon: Marylynn Pearson, MD;  Location: [facility];  Service: Ophthalmology;  Laterality: Left;  . EYE SURGERY     glass removed fr. R eye   . GASTRIC BYPASS  12/2004   [place], [place], weight was 400lbs. prior to   . HERNIA REPAIR     inguinal- L   .  IRRIGATION AND DEBRIDEMENT SEBACEOUS CYST Right    arm- upper, cysts both removed  . TESTICLE REMOVAL  1960's, 2001   both removed   . TONSILLECTOMY      Current Medications: Current Meds  Medication Sig  . albuterol (PROVENTIL HFA;VENTOLIN HFA) 108 (90 Base) MCG/ACT inhaler Inhale into the lungs.  . cyclobenzaprine (FLEXERIL) 10 MG tablet Take 10 mg by mouth 3 (three) times daily as needed.  Marland Kitchen ibuprofen (ADVIL,MOTRIN) 400 MG tablet Take 400 mg by mouth every 6 (six) hours as needed for mild pain.  Marland Kitchen lisinopril (PRINIVIL,ZESTRIL) 10 MG tablet Take 5 mg by mouth daily.   . metFORMIN (GLUCOPHAGE) 500 MG tablet metformin 500 mg tablet  Take 1 tablet twice a day by oral route.  . Multiple Vitamin (MULTIVITAMIN WITH MINERALS) TABS tablet Take 1 tablet by mouth daily.  . nitroGLYCERIN (NITROSTAT) 0.4 MG SL tablet Place 1 tablet (0.4 mg total) under the tongue every 5 (five) minutes as needed for chest pain. Max of 3 doses.  Marland Kitchen pyridOXINE (VITAMIN B-6) 100 MG tablet Take 100 mg by mouth daily.  Marland Kitchen testosterone cypionate (DEPOTESTOSTERONE CYPIONATE) 200 MG/ML injection Inject into the muscle every 14 (fourteen) days. 52ml. q 2 weeks  . vitamin B-12 (CYANOCOBALAMIN) 1000 MCG tablet Take 1,000 mcg by mouth daily.     Allergies:   Patient has no known allergies.   Social History   Social History  . Marital status: Married    Spouse name: N/A  . Number of children: N/A  . Years of education: N/A   Social History Main Topics  . Smoking status: Never Smoker  . Smokeless tobacco: Never Used  . Alcohol use Yes     Comment: rare use of beer  . Drug use: No  . Sexual activity: Not Asked   Other Topics Concern  . None   Social History Narrative  . None     Family History: The patient's family history includes COPD in his father; Cancer in his father, mother, paternal grandfather, and sister; Emphysema in his father.  ROS:   Please see the history of present illness.    All other  systems reviewed and are negative.  EKGs/Labs/Other Studies Reviewed:    The following studies were reviewed today: I reviewed records from primary care physician, extensively. EKG reveals sinus rhythm and nonspecific ST-T changes.eft anterior fascicular block and findings suggestive of possible anterior wall myocardial infarction in the past   Recent Labs: 02/07/2017: ALT 18; BUN 20; Creatinine, Ser 1.15; Magnesium 1.9; Potassium 4.6; Sodium 140; TSH 0.97  Recent Lipid Panel    Component Value Date/Time   CHOL 130 02/07/2017 1044   TRIG 78.0 02/07/2017 1044   HDL 41.40 02/07/2017 1044   CHOLHDL 3 02/07/2017 1044   VLDL 15.6 02/07/2017 1044   LDLCALC 73 02/07/2017 1044    Physical Exam:    VS:  BP 126/66   Pulse  71   Ht 5\' 10"  (1.778 m)   Wt 275 lb 12.8 oz (125.1 kg)   SpO2 95%   BMI 39.57 kg/m     Wt Readings from Last 3 Encounters:  02/09/17 275 lb 12.8 oz (125.1 kg)  02/07/17 275 lb (124.7 kg)  08/19/15 265 lb (120.2 kg)     GEN: Patient is in no acute distress HEENT: Normal NECK: No JVD; No carotid bruits LYMPHATICS: No lymphadenopathy CARDIAC: S1 S2 regular, 2/6 systolic murmur at the apex. RESPIRATORY:  Clear to auscultation without rales, wheezing or rhonchi  ABDOMEN: Soft, non-tender, non-distended MUSCULOSKELETAL:  No edema; No deformity  SKIN: Warm and dry NEUROLOGIC:  Alert and oriented x 3 PSYCHIATRIC:  Normal affect    Signed, Jenean Lindau, MD  02/09/2017 12:14 PM    [facility]

## 2017-02-15 NOTE — Discharge Instructions (Signed)
°  HOLD METFORMIN FOR 48 HOURS. MAY RESUME ON SAT AM.  Radial Site Care Refer to this sheet in the next few weeks. These instructions provide you with information about caring for yourself after your procedure. Your health care provider may also give you more specific instructions. Your treatment has been planned according to current medical practices, but problems sometimes occur. Call your health care provider if you have any problems or questions after your procedure. What can I expect after the procedure? After your procedure, it is typical to have the following:  Bruising at the radial site that usually fades within 1-2 weeks.  Blood collecting in the tissue (hematoma) that may be painful to the touch. It should usually decrease in size and tenderness within 1-2 weeks.  Follow these instructions at home:  Take medicines only as directed by your health care provider.  You may shower 24-48 hours after the procedure or as directed by your health care provider. Remove the bandage (dressing) and gently wash the site with plain soap and water. Pat the area dry with a clean towel. Do not rub the site, because this may cause bleeding.  Do not take baths, swim, or use a hot tub until your health care provider approves.  Check your insertion site every day for redness, swelling, or drainage.  Do not apply powder or lotion to the site.  Do not flex or bend the affected arm for 24 hours or as directed by your health care provider.  Do not push or pull heavy objects with the affected arm for 24 hours or as directed by your health care provider.  Do not lift over 10 lb (4.5 kg) for 5 days after your procedure or as directed by your health care provider.  Ask your health care provider when it is okay to: ? Return to work or school. ? Resume usual physical activities or sports. ? Resume sexual activity.  Do not drive home if you are discharged the same day as the procedure. Have someone else  drive you.  You may drive 24 hours after the procedure unless otherwise instructed by your health care provider.  Do not operate machinery or power tools for 24 hours after the procedure.  If your procedure was done as an outpatient procedure, which means that you went home the same day as your procedure, a responsible adult should be with you for the first 24 hours after you arrive home.  Keep all follow-up visits as directed by your health care provider. This is important. Contact a health care provider if:  You have a fever.  You have chills.  You have increased bleeding from the radial site. Hold pressure on the site. Get help right away if:  You have unusual pain at the radial site.  You have redness, warmth, or swelling at the radial site.  You have drainage (other than a small amount of blood on the dressing) from the radial site.  The radial site is bleeding, and the bleeding does not stop after 30 minutes of holding steady pressure on the site.  Your arm or hand becomes pale, cool, tingly, or numb. This information is not intended to replace advice given to you by your health care provider. Make sure you discuss any questions you have with your health care provider. Document Released: 05/28/2010 Document Revised: 10/01/2015 Document Reviewed: 11/11/2013 Elsevier Interactive Patient Education  2018 [employer].

## 2017-02-16 ENCOUNTER — Encounter (HOSPITAL_COMMUNITY): Payer: Self-pay | Admitting: Cardiology

## 2017-02-16 MED FILL — Lidocaine HCl Local Inj 2%: INTRAMUSCULAR | Qty: 10 | Status: AC

## 2017-03-16 ENCOUNTER — Encounter: Payer: Self-pay | Admitting: Cardiology

## 2017-03-16 ENCOUNTER — Ambulatory Visit (INDEPENDENT_AMBULATORY_CARE_PROVIDER_SITE_OTHER): Payer: Medicare Other | Admitting: Cardiology

## 2017-03-16 VITALS — BP 146/80 | HR 50 | Ht 71.0 in | Wt 275.1 lb

## 2017-03-16 DIAGNOSIS — E669 Obesity, unspecified: Secondary | ICD-10-CM | POA: Diagnosis not present

## 2017-03-16 DIAGNOSIS — Z9884 Bariatric surgery status: Secondary | ICD-10-CM | POA: Diagnosis not present

## 2017-03-16 DIAGNOSIS — G4733 Obstructive sleep apnea (adult) (pediatric): Secondary | ICD-10-CM | POA: Diagnosis not present

## 2017-03-16 DIAGNOSIS — R0609 Other forms of dyspnea: Secondary | ICD-10-CM | POA: Diagnosis not present

## 2017-03-16 DIAGNOSIS — E119 Type 2 diabetes mellitus without complications: Secondary | ICD-10-CM | POA: Diagnosis not present

## 2017-03-16 NOTE — Progress Notes (Signed)
Cardiology Office Note:    Date:  03/16/2017   ID:  Aaron Shah, DOB 23-Dec-1950, MRN [id number]  PCP:  Brunetta Jeans, PA-C  Cardiologist:  Jenean Lindau, MD   Referring MD: Brunetta Jeans, PA-C    ASSESSMENT:    1. Diabetes mellitus without complication ([place])   2. Dyspnea on exertion   3. History of gastric bypass   4. Obesity (BMI 30-39.9)   5. OSA (obstructive sleep apnea)    PLAN:    In order of problems listed above:  1. Primary prevention stressed with the patient.  Importance of compliance with diet and medication stressed and he vocalized understanding. 2. Diet was discussed with dyslipidemia and diabetes mellitus.  Importance of regular exercise stressed.  Diet was discussed for obesity and risks of obesity explained he plans to follow this in a very strictly in a meticulous way.  This will be followed by his primary care physician and he will be seen in follow-up appointment only on a as necessary basis. 3. For the record his coronary angiography was within normal limits.   Medication Adjustments/Labs and Tests Ordered: Current medicines are reviewed at length with the patient today.  Concerns regarding medicines are outlined above.  No orders of the defined types were placed in this encounter.  No orders of the defined types were placed in this encounter.    Chief Complaint  Patient presents with  . Follow-up    Post Cath with No [place]      History of Present Illness:    Aaron Shah is a 66 y.o. male.  Patient was evaluated by me for significant symptoms suggestive of angina.  Patient underwent coronary angiography and this was unremarkable and revealed only mild nonobstructive disease.  Subsequently he has been doing fine.  No chest pain orthopnea or PND.  At the time of my evaluation, the patient is alert awake oriented and in no distress.  Past Medical History:  Diagnosis Date  . Arthritis    entire spine, C4,5,6- HNP, seeing MD at  [facility]   . Asthma    h/o- uses Proventil when he has a flare   . Cataract   . Concussion 05/2013   resulted from a fall, had been rx for depression after that but developed feelings of homicide & suicide, med. was d/c'd  . Diabetes mellitus without complication ([place]) 6789   oral med.   . H/O exercise stress test 2016   pt,. reports that it was done thru the [place] in [place]  . Hypertension   . Pneumonia 1970's   hosp. in [employer], pt. reports that he is prone to getting bronchitis   . Sleep apnea 2016   CPAP machine but not using at present, test done in [place] , will have a new study here in [place]     Past Surgical History:  Procedure Laterality Date  . EYE SURGERY     glass removed fr. R eye   . GASTRIC BYPASS  12/2004   [place], [place], weight was 400lbs. prior to   . HERNIA REPAIR     inguinal- L   . IRRIGATION AND DEBRIDEMENT SEBACEOUS CYST Right    arm- upper, cysts both removed  . TESTICLE REMOVAL  1960's, 2001   both removed   . TONSILLECTOMY      Current Medications: Current Meds  Medication Sig  . albuterol (PROVENTIL HFA;VENTOLIN HFA) 108 (90 Base) MCG/ACT inhaler Inhale 1 puff into the  lungs every 6 (six) hours as needed for wheezing or shortness of breath.   Marland Kitchen aspirin EC 81 MG tablet Take 1 tablet (81 mg total) by mouth daily.  Marland Kitchen ibuprofen (ADVIL,MOTRIN) 400 MG tablet Take 400 mg by mouth every 6 (six) hours as needed for mild pain or moderate pain.   Marland Kitchen lisinopril (PRINIVIL,ZESTRIL) 10 MG tablet Take 5 mg by mouth daily.   . metFORMIN (GLUCOPHAGE) 500 MG tablet Take 2 tablets by mouth in the morning and 1 tablet in the evening  . Multiple Vitamin (MULTIVITAMIN WITH MINERALS) TABS tablet Take 1 tablet by mouth daily.  . nitroGLYCERIN (NITROSTAT) 0.4 MG SL tablet Place 1 tablet (0.4 mg total) under the tongue every 5 (five) minutes as needed for chest pain. Max of 3 doses.  Marland Kitchen pyridOXINE (VITAMIN B-6) 100 MG tablet Take 100 mg by mouth  daily.  Marland Kitchen testosterone cypionate (DEPOTESTOSTERONE CYPIONATE) 200 MG/ML injection Inject into the muscle every 14 (fourteen) days. 40ml. q 2 weeks  . vitamin B-12 (CYANOCOBALAMIN) 1000 MCG tablet Take 1,000 mcg by mouth daily.     Allergies:   Patient has no known allergies.   Social History   Socioeconomic History  . Marital status: Married    Spouse name: None  . Number of children: None  . Years of education: None  . Highest education level: None  Social Needs  . Financial resource strain: None  . Food insecurity - worry: None  . Food insecurity - inability: None  . Transportation needs - medical: None  . Transportation needs - non-medical: None  Occupational History  . None  Tobacco Use  . Smoking status: Never Smoker  . Smokeless tobacco: Never Used  Substance and Sexual Activity  . Alcohol use: Yes    Comment: rare use of beer  . Drug use: No  . Sexual activity: None  Other Topics Concern  . None  Social History Narrative  . None     Family History: The patient's family history includes COPD in his father; Cancer in his father, mother, paternal grandfather, and sister; Emphysema in his father.  ROS:   Please see the history of present illness.    All other systems reviewed and are negative.  EKGs/Labs/Other Studies Reviewed:    The following studies were reviewed today: I discussed the findings on the coronary angiography with the patient in extensive length.   Recent Labs: 02/07/2017: ALT 18; Magnesium 1.9; TSH 0.97 02/15/2017: BUN 20; Creatinine, Ser 1.21; Hemoglobin 13.4; Platelets 279; Potassium 4.2; Sodium 139  Recent Lipid Panel    Component Value Date/Time   CHOL 130 02/07/2017 1044   TRIG 78.0 02/07/2017 1044   HDL 41.40 02/07/2017 1044   CHOLHDL 3 02/07/2017 1044   VLDL 15.6 02/07/2017 1044   LDLCALC 73 02/07/2017 1044    Physical Exam:    VS:  BP (!) 146/80   Pulse (!) 50   Ht 5\' 11"  (1.803 m)   Wt 275 lb 1.3 oz (124.8 kg)   SpO2 95%    BMI 38.37 kg/m     Wt Readings from Last 3 Encounters:  03/16/17 275 lb 1.3 oz (124.8 kg)  02/15/17 275 lb (124.7 kg)  02/09/17 275 lb 12.8 oz (125.1 kg)     GEN: Patient is in no acute distress HEENT: Normal NECK: No JVD; No carotid bruits LYMPHATICS: No lymphadenopathy CARDIAC: Hear sounds regular, 2/6 systolic murmur at the apex. RESPIRATORY:  Clear to auscultation without rales, wheezing or rhonchi  ABDOMEN: Soft, non-tender, non-distended MUSCULOSKELETAL:  No edema; No deformity  SKIN: Warm and dry NEUROLOGIC:  Alert and oriented x 3 PSYCHIATRIC:  Normal affect   Signed, Jenean Lindau, MD  03/16/2017 2:24 PM    [place]

## 2017-03-16 NOTE — Patient Instructions (Signed)

## 2017-06-28 ENCOUNTER — Encounter: Payer: Self-pay | Admitting: Emergency Medicine

## 2017-07-29 DIAGNOSIS — M5431 Sciatica, right side: Secondary | ICD-10-CM | POA: Diagnosis not present

## 2017-07-31 ENCOUNTER — Encounter (HOSPITAL_BASED_OUTPATIENT_CLINIC_OR_DEPARTMENT_OTHER): Payer: Self-pay | Admitting: Emergency Medicine

## 2017-07-31 ENCOUNTER — Emergency Department (HOSPITAL_BASED_OUTPATIENT_CLINIC_OR_DEPARTMENT_OTHER): Payer: Medicare HMO

## 2017-07-31 ENCOUNTER — Other Ambulatory Visit: Payer: Self-pay

## 2017-07-31 ENCOUNTER — Telehealth: Payer: Self-pay | Admitting: Family Medicine

## 2017-07-31 ENCOUNTER — Emergency Department (HOSPITAL_BASED_OUTPATIENT_CLINIC_OR_DEPARTMENT_OTHER)
Admission: EM | Admit: 2017-07-31 | Discharge: 2017-07-31 | Disposition: A | Discharge status: Home / Self Care | Payer: Medicare HMO | Attending: Emergency Medicine | Admitting: Emergency Medicine

## 2017-07-31 DIAGNOSIS — Z79899 Other long term (current) drug therapy: Secondary | ICD-10-CM | POA: Insufficient documentation

## 2017-07-31 DIAGNOSIS — M5416 Radiculopathy, lumbar region: Secondary | ICD-10-CM | POA: Diagnosis not present

## 2017-07-31 DIAGNOSIS — M549 Dorsalgia, unspecified: Secondary | ICD-10-CM | POA: Diagnosis not present

## 2017-07-31 DIAGNOSIS — M21372 Foot drop, left foot: Secondary | ICD-10-CM

## 2017-07-31 DIAGNOSIS — E119 Type 2 diabetes mellitus without complications: Secondary | ICD-10-CM | POA: Insufficient documentation

## 2017-07-31 DIAGNOSIS — I10 Essential (primary) hypertension: Secondary | ICD-10-CM | POA: Diagnosis not present

## 2017-07-31 DIAGNOSIS — Z5321 Procedure and treatment not carried out due to patient leaving prior to being seen by health care provider: Secondary | ICD-10-CM | POA: Insufficient documentation

## 2017-07-31 NOTE — ED Notes (Addendum)
NAD at this time. Pt is stable and going to [facility].

## 2017-07-31 NOTE — ED Triage Notes (Signed)
Patient reports left sided back pain after jumping out of the dump truck on Wedneday.  Reports the pain radiates down his entire left leg.  Seen at urgent care on Saturday and given RX for flexeril and codeine.

## 2017-07-31 NOTE — Discharge Instructions (Signed)
Do not eat or drink. Directly to [facility] emergency room for MRI.

## 2017-07-31 NOTE — ED Provider Notes (Signed)
[facility] EMERGENCY DEPARTMENT Provider Note   CSN: [id number] Arrival date & time: 07/31/17  1538     History   Chief Complaint Chief Complaint  Patient presents with  . Back Pain    HPI Cypher Paule is a 67 y.o. male.  Back pain and weak left leg.  HPI: 67 year old male presents with back pain and leg weakness after jumping from a truck.  Mr. Chopin is 70.  He is overweight.  He states he occasionally has back pain but nothing on a regular basis.  No history of imaging, or surgeries.  He works driving a dump truck.  On Wednesday, 5 days ago he was getting out of the cab.  He states he hops down from the step.  This is 2-3 feet.  He felt a "twinge" in his back.  It did not bother him significantly the following day when he was off.  He went back to work Friday.  By midday his back was "getting tight".  By Saturday his leg was weak painful he could not walk without a significant amount of pain.  Now he states his entire leg is numb his foot is weak he can barely walk.  He is limited more by penis, but does have significant pain.  Seen in urgent care on Saturday.  Given a shot of "steroids" and hydrocodone.  He is still having significant pain and weakness.  He denies incontinence of bowel or bladder.  He has had a bowel movement and urinated today.  He is not incontinent of either.  He has normal perineal sensation per his report.  No priapism.  Past Medical History:  Diagnosis Date  . Diabetes mellitus without complication ([place])   . Hypertension     There are no active problems to display for this patient.      Home Medications    Prior to Admission medications   Medication Sig Start Date End Date Taking? Authorizing Provider  colchicine 0.6 MG tablet Take 0.6 mg by mouth daily.   Yes [provider]  lisinopril (PRINIVIL,ZESTRIL) 10 MG tablet Take 10 mg by mouth daily.   Yes [provider]  metFORMIN (GLUCOPHAGE) 500 MG tablet Take 250 mg  by mouth 2 (two) times daily with a meal.   Yes [provider]    Family History History reviewed. No pertinent family history.  Social History Social History   Tobacco Use  . Smoking status: Not on file  Substance Use Topics  . Alcohol use: Not on file  . Drug use: Not on file     Allergies   Patient has no known allergies.   Review of Systems Review of Systems  Constitutional: Negative for appetite change, chills, diaphoresis, fatigue and fever.  HENT: Negative for mouth sores, sore throat and trouble swallowing.   Eyes: Negative for visual disturbance.  Respiratory: Negative for cough, chest tightness, shortness of breath and wheezing.   Cardiovascular: Negative for chest pain.  Gastrointestinal: Negative for abdominal distention, abdominal pain, diarrhea, nausea and vomiting.  Endocrine: Negative for polydipsia, polyphagia and polyuria.  Genitourinary: Negative for dysuria, frequency and hematuria.  Musculoskeletal: Positive for back pain and gait problem.  Skin: Negative for color change, pallor and rash.  Neurological: Positive for weakness and numbness. Negative for dizziness, syncope, light-headedness and headaches.  Hematological: Does not bruise/bleed easily.  Psychiatric/Behavioral: Negative for behavioral problems and confusion.     Physical Exam Updated Vital Signs BP 128/63 (BP Location: Left Arm)  Pulse 72   Temp 98.8 F (37.1 C) (Oral)   Resp 20   Ht 5\' 11"  (1.803 m)   Wt 121.6 kg (268 lb)   SpO2 98%   BMI 37.38 kg/m   Physical Exam  Constitutional: He appears well-developed and well-nourished. No distress.  HENT:  Head: Normocephalic.  Eyes: Pupils are equal, round, and reactive to light. Conjunctivae are normal. No scleral icterus.  Neck: Normal range of motion. Neck supple. No thyromegaly present.  Cardiovascular: Normal rate and regular rhythm. Exam reveals no gallop and no friction rub.  No murmur heard. Pulmonary/Chest:  Effort normal and breath sounds normal. No respiratory distress. He has no wheezes. He has no rales.  Abdominal: Soft. Bowel sounds are normal. He exhibits no distension. There is no tenderness. There is no rebound.  Musculoskeletal: Normal range of motion.  Neurological:  Normal knee jerk reflexes symmetric bilaterally.  His right Achilles is 2+.  His left is 0.  No clonus.  He has pain and reported slightly decreased sensation and pins and needles on the lateral left leg to the dorsum of the foot to the first webspace.  He has foot drop.  He cannot actively dorsiflex or plantarflex the left foot or toes.  Patient in hallway bed, therefore rectal exam not performed.  Skin: Skin is warm and dry. No rash noted.  Psychiatric: He has a normal mood and affect. His behavior is normal.     ED Treatments / Results  Labs (all labs ordered are listed, but only abnormal results are displayed) Labs Reviewed - No data to display  EKG None  Radiology Dg Lumbar Spine Complete  Result Date: 07/31/2017 CLINICAL DATA:  Left-sided back pain with leg numbness EXAM: LUMBAR SPINE - COMPLETE 4+ VIEW COMPARISON:  None. FINDINGS: Lumbar alignment within normal limits. Vertebral body heights are maintained. Moderate degenerative changes at L2-L3 and L3-L4 with mild degenerative changes at L4-L5 and L5-S1. Vascular calcifications. IMPRESSION: Mild to moderate degenerative changes of the spine. No acute osseous abnormality Electronically Signed   By: Donavan Foil M.D.   On: 07/31/2017 19:21    Procedures Procedures (including critical care time)  Medications Ordered in ED Medications - No data to display   Initial Impression / Assessment and Plan / ED Course  I have reviewed the triage vital signs and the nursing notes.  Pertinent labs & imaging results that were available during my care of the patient were reviewed by me and considered in my medical decision making (see chart for details).   Patient was  signs of L5-S1 radiculopathy and neurological loss with no Achilles read flex and foot drop.  He had trauma therefore plain films were obtained and show no compression fracture or other acute bony abnormalities.  I attempted to obtain outpatient MRI on this patient in the next 48 hours and was unable to do so through the [facility] system.  No MRI available at my facility tonight, or until Saturday, 5 days from now.  Discussed the case with neurosurgeon on-call, Dr.Nundkumar.  As the patient has had subjective signs of neurological loss for 48 hours he recommended imaging tonight.  Gust this at length with patient.  I have offered ambulance transport to [facility] he politely declines.  His wife will take him.  Of asked him to be n.p.o.  Dr.Nundkumar Sstated that if his MRI showed obvious herniation in compromise he would consider admission for surgical procedure tomorrow.  Discussed the case with my partner Dr. Rex Kras  in the emergency room at [facility].   Final Clinical Impressions(s) / ED Diagnoses   Final diagnoses:  Lumbar radiculopathy  Left foot drop    ED Discharge Orders    None       Tanna Furry, MD 08/01/17 0028

## 2017-08-01 ENCOUNTER — Ambulatory Visit (HOSPITAL_COMMUNITY)
Admission: RE | Admit: 2017-08-01 | Discharge: 2017-08-01 | Disposition: A | Discharge status: Home / Self Care | Payer: Medicare HMO | Source: Ambulatory Visit | Attending: Family Medicine | Admitting: Family Medicine

## 2017-08-01 ENCOUNTER — Encounter (INDEPENDENT_AMBULATORY_CARE_PROVIDER_SITE_OTHER): Payer: Self-pay

## 2017-08-01 ENCOUNTER — Emergency Department (HOSPITAL_COMMUNITY)
Admission: EM | Admit: 2017-08-01 | Discharge: 2017-08-02 | Disposition: A | Discharge status: Home / Self Care | Payer: Medicare HMO | Source: Home / Self Care | Attending: Emergency Medicine | Admitting: Emergency Medicine

## 2017-08-01 ENCOUNTER — Telehealth: Payer: Self-pay | Admitting: Family Medicine

## 2017-08-01 ENCOUNTER — Emergency Department (HOSPITAL_COMMUNITY): Payer: Medicare HMO

## 2017-08-01 ENCOUNTER — Encounter: Payer: Self-pay | Admitting: Cardiology

## 2017-08-01 ENCOUNTER — Encounter (HOSPITAL_COMMUNITY): Payer: Self-pay | Admitting: *Deleted

## 2017-08-01 ENCOUNTER — Encounter: Payer: Self-pay | Admitting: Family Medicine

## 2017-08-01 ENCOUNTER — Emergency Department (HOSPITAL_COMMUNITY)
Admission: EM | Admit: 2017-08-01 | Discharge: 2017-08-01 | Disposition: A | Discharge status: Home / Self Care | Payer: Medicare HMO | Attending: Emergency Medicine | Admitting: Emergency Medicine

## 2017-08-01 ENCOUNTER — Other Ambulatory Visit: Payer: Self-pay

## 2017-08-01 ENCOUNTER — Encounter (HOSPITAL_COMMUNITY): Payer: Self-pay | Admitting: Cardiology

## 2017-08-01 ENCOUNTER — Ambulatory Visit (INDEPENDENT_AMBULATORY_CARE_PROVIDER_SITE_OTHER): Payer: Medicare HMO | Admitting: Family Medicine

## 2017-08-01 VITALS — BP 130/74 | HR 68 | Temp 97.9°F

## 2017-08-01 DIAGNOSIS — Z79899 Other long term (current) drug therapy: Secondary | ICD-10-CM

## 2017-08-01 DIAGNOSIS — M21372 Foot drop, left foot: Secondary | ICD-10-CM | POA: Diagnosis not present

## 2017-08-01 DIAGNOSIS — R531 Weakness: Secondary | ICD-10-CM | POA: Diagnosis not present

## 2017-08-01 DIAGNOSIS — M5126 Other intervertebral disc displacement, lumbar region: Secondary | ICD-10-CM

## 2017-08-01 DIAGNOSIS — M2578 Osteophyte, vertebrae: Secondary | ICD-10-CM | POA: Insufficient documentation

## 2017-08-01 DIAGNOSIS — M545 Low back pain: Secondary | ICD-10-CM | POA: Diagnosis not present

## 2017-08-01 DIAGNOSIS — M47816 Spondylosis without myelopathy or radiculopathy, lumbar region: Secondary | ICD-10-CM | POA: Insufficient documentation

## 2017-08-01 DIAGNOSIS — M48061 Spinal stenosis, lumbar region without neurogenic claudication: Secondary | ICD-10-CM | POA: Insufficient documentation

## 2017-08-01 DIAGNOSIS — Z7984 Long term (current) use of oral hypoglycemic drugs: Secondary | ICD-10-CM | POA: Diagnosis not present

## 2017-08-01 DIAGNOSIS — M5442 Lumbago with sciatica, left side: Secondary | ICD-10-CM | POA: Diagnosis not present

## 2017-08-01 DIAGNOSIS — J45909 Unspecified asthma, uncomplicated: Secondary | ICD-10-CM

## 2017-08-01 DIAGNOSIS — E119 Type 2 diabetes mellitus without complications: Secondary | ICD-10-CM

## 2017-08-01 DIAGNOSIS — I10 Essential (primary) hypertension: Secondary | ICD-10-CM

## 2017-08-01 DIAGNOSIS — M5127 Other intervertebral disc displacement, lumbosacral region: Secondary | ICD-10-CM | POA: Insufficient documentation

## 2017-08-01 MED ORDER — OXYCODONE-ACETAMINOPHEN 5-325 MG PO TABS
1.0000 | ORAL_TABLET | ORAL | Status: DC | PRN
  Administered 2017-08-01: 1 via ORAL
  Filled 2017-08-01: qty 1

## 2017-08-01 MED ORDER — HYDROCODONE-ACETAMINOPHEN 5-325 MG PO TABS: 2.0000 | ORAL_TABLET | Freq: Four times a day (QID) | ORAL | 0 refills | Status: AC | PRN

## 2017-08-01 MED ORDER — OXYCODONE HCL 5 MG PO TABS: ORAL_TABLET | ORAL | 0 refills | Status: DC

## 2017-08-01 MED ORDER — HYDROCODONE-ACETAMINOPHEN 5-325 MG PO TABS
2.0000 | ORAL_TABLET | Freq: Once | ORAL | Status: AC
  Administered 2017-08-02: 2 via ORAL
  Filled 2017-08-01: qty 2

## 2017-08-01 MED ORDER — DIAZEPAM 10 MG PO TABS: ORAL_TABLET | ORAL | 0 refills | Status: DC

## 2017-08-01 NOTE — ED Triage Notes (Signed)
Pt sent over as a transfer from [facility] for a MRI. Pt had a fall on Wednesday. Since the fall, pt has had increased pain to lower pain with spasms, swelling, and numbness to L lower leg

## 2017-08-01 NOTE — ED Provider Notes (Signed)
[facility] EMERGENCY DEPARTMENT Provider Note   CSN: [id number] Arrival date & time: 08/01/17  1729     History   Chief Complaint Chief Complaint  Patient presents with  . Back Pain    HPI Aaron Shah is a 67 y.o. male.  HPI  This is a 67 year old male who presents with back pain.  Patient has been seen and evaluated yesterday at [facility].  At that time there was concerns for foot drop and left lower extremity weakness in the setting of back pain and sciatica.  He was sent to [facility] ER at that time to have an MRI.  Patient waited in the waiting room for an extended period of time.  He was unable to tolerate MRI because laying flat made him too uncomfortable.  He left prior to having imaging done.  He followed up with his primary doctor today who ordered an outpatient MRI.  He was referred here given results.  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  He subsequently had worsening pain and numbness of the left leg.  Reports pain shoots from his back down his left thigh to the posterior knee.  He also reports numbness on the outer aspect of the left calf.  Rates his pain at this time a 8 out of 10.  Has taken some hydrocodone and Flexeril at home with some relief.  Denies any bowel or bladder difficulties at this time.  Patient denies any progression of symptoms from yesterday; however, he reports residual weakness and difficulty walking with the left lower extremity.  I have reviewed the patient's chart.  MRI shows an L5-S1 disc herniation with epidural blood.  Patient is not on any blood thinners.  Past Medical History:  Diagnosis Date  . Arthritis    entire spine, C4,5,6- HNP, seeing MD at [facility]   . Asthma    h/o- uses Proventil when he has a flare   . Cataract   . Concussion 05/2013   resulted from a fall, had been rx for depression after that but developed feelings of homicide & suicide, med. was d/c'd  .  Diabetes mellitus without complication ([place]) 0177   oral med.   . Diabetes mellitus without complication ([place])   . H/O exercise stress test 2016   pt,. reports that it was done thru the [place] in [place]  . Hypertension   . Pneumonia 1970's   [place]. [place], pt. reports that he is prone to getting bronchitis   . Sleep apnea 2016   CPAP machine but not using at present, test done in [place] , will have a new study here in [place]     Patient Active Problem List   Diagnosis Date Noted  . Dyspnea on exertion 02/09/2017  . Diabetes mellitus without complication ([place]) 93/90/3009  . History of gastric bypass 02/07/2017  . OSA (obstructive sleep apnea) 02/07/2017  . Obesity (BMI 30-39.9) 02/07/2017  . Hypogonadism in male 02/07/2017    Past Surgical History:  Procedure Laterality Date  . CATARACT EXTRACTION W/PHACO Left 08/19/2015   Procedure: CATARACT EXTRACTION PHACO AND INTRAOCULAR LENS PLACEMENT (IOC) LEFT EYE;  Surgeon: Marylynn Pearson, MD;  Location: [facility];  Service: Ophthalmology;  Laterality: Left;  . EYE SURGERY     glass removed fr. R eye   . GASTRIC BYPASS  12/2004   [place], [place], weight was 400lbs. prior to   .  HERNIA REPAIR     inguinal- L   . IRRIGATION AND DEBRIDEMENT SEBACEOUS CYST Right    arm- upper, cysts both removed  . LEFT HEART CATH AND CORONARY ANGIOGRAPHY N/A 02/15/2017   Procedure: LEFT HEART CATH AND CORONARY ANGIOGRAPHY;  Surgeon: Leonie Man, MD;  Location: [facility];  Service: Cardiovascular;  Laterality: N/A;  . TESTICLE REMOVAL  1960's, 2001   both removed   . TONSILLECTOMY          Home Medications    Prior to Admission medications   Medication Sig Start Date End Date Taking? Authorizing Provider  albuterol (PROVENTIL HFA;VENTOLIN HFA) 108 (90 Base) MCG/ACT inhaler Inhale 1 puff into the lungs every 6 (six) hours as needed for wheezing or shortness of breath.  02/19/16  Yes [provider]    colchicine 0.6 MG tablet Take 0.12 mg by mouth every evening.    Yes [provider]  cyclobenzaprine (FLEXERIL) 10 MG tablet Take 10 mg by mouth at bedtime.  08/06/15 08/13/17 Yes [provider]  ibuprofen (ADVIL,MOTRIN) 400 MG tablet Take 400 mg by mouth every 6 (six) hours as needed for mild pain or moderate pain.    Yes [provider]  lisinopril (PRINIVIL,ZESTRIL) 10 MG tablet Take 10 mg by mouth daily.   Yes [provider]  metFORMIN (GLUCOPHAGE) 500 MG tablet Take 250 mg by mouth 2 (two) times daily with a meal.   Yes [provider]  Multiple Vitamin (MULTIVITAMIN WITH MINERALS) TABS tablet Take 1 tablet by mouth daily.   Yes [provider]  pyridOXINE (VITAMIN B-6) 100 MG tablet Take 100 mg by mouth daily.   Yes [provider]  testosterone cypionate (DEPOTESTOSTERONE CYPIONATE) 200 MG/ML injection Inject 200 mg into the muscle every 14 (fourteen) days. 53ml. q 2 weeks    Yes [provider]  vitamin B-12 (CYANOCOBALAMIN) 1000 MCG tablet Take 1,000 mcg by mouth daily.   Yes [provider]  diazepam (VALIUM) 10 MG tablet Take 1 pill 30 min to 1 hour prior to MRI Patient not taking: Reported on 08/01/2017 08/01/17   Timmothy Euler, MD  HYDROcodone-acetaminophen (NORCO/VICODIN) 5-325 MG tablet Take 2 tablets by mouth every 6 (six) hours as needed for up to 5 days for moderate pain. 08/01/17 08/06/17  Merryl Hacker, MD  oxyCODONE (OXY IR/ROXICODONE) 5 MG immediate release tablet Take 2 tabs 30 min prior to MRI Patient not taking: Reported on 08/01/2017 08/01/17   Timmothy Euler, MD    Family History Family History  Problem Relation Age of Onset  . Cancer Mother        Lung  . Cancer Father        Kidney, Lung  . Emphysema Father   . COPD Father   . Cancer Sister        Skin   . Cancer Paternal Grandfather        Intestinal    Social History Social History   Tobacco Use  . Smoking  status: Never Smoker  Substance Use Topics  . Alcohol use: Yes    Comment: rare use of beer  . Drug use: No     Allergies   Patient has no known allergies.   Review of Systems Review of Systems  Constitutional: Negative for fever.  Respiratory: Negative for shortness of breath.   Cardiovascular: Negative for chest pain.  Genitourinary: Negative for difficulty urinating.  Musculoskeletal: Positive for back pain.  Neurological:  Positive for weakness and numbness.  All other systems reviewed and are negative.    Physical Exam Updated Vital Signs BP (!) 162/73 (BP Location: Left Arm)   Pulse (!) 58   Temp 98.2 F (36.8 C) (Oral)   Resp 20   Ht 5\' 11"  (1.803 m)   Wt 121.6 kg (268 lb)   SpO2 98%   BMI 37.38 kg/m   Physical Exam  Constitutional: He is oriented to person, place, and time. He appears well-developed and well-nourished.  Overweight  HENT:  Head: Normocephalic and atraumatic.  Cardiovascular: Normal rate, regular rhythm and normal heart sounds.  Pulmonary/Chest: Effort normal. No respiratory distress.  Abdominal: Soft. There is no tenderness.  Musculoskeletal: He exhibits no edema.  Neurological: He is alert and oriented to person, place, and time.  Positive left straight leg raise, normal patellar reflexes bilaterally, absent Achilles reflex, unable to dorsiflex the left foot, 4 out of 5 plantar flexion present.  Skin: Skin is warm and dry.  Psychiatric: He has a normal mood and affect.  Nursing note and vitals reviewed.    ED Treatments / Results  Labs (all labs ordered are listed, but only abnormal results are displayed) Labs Reviewed - No data to display  EKG None  Radiology Dg Lumbar Spine Complete  Result Date: 07/31/2017 CLINICAL DATA:  Left-sided back pain with leg numbness EXAM: LUMBAR SPINE - COMPLETE 4+ VIEW COMPARISON:  None. FINDINGS: Lumbar alignment within normal limits. Vertebral body heights are maintained. Moderate degenerative  changes at L2-L3 and L3-L4 with mild degenerative changes at L4-L5 and L5-S1. Vascular calcifications. IMPRESSION: Mild to moderate degenerative changes of the spine. No acute osseous abnormality Electronically Signed   By: Donavan Foil M.D.   On: 07/31/2017 19:21   Mr Lumbar Spine Wo Contrast  Result Date: 08/01/2017 CLINICAL DATA:  Recent fall from a truck with low back pain extending into the left lower extremity. Left foot numbness. EXAM: MRI LUMBAR SPINE WITHOUT CONTRAST TECHNIQUE: Multiplanar, multisequence MR imaging of the lumbar spine was performed. No intravenous contrast was administered. COMPARISON:  None. FINDINGS: Segmentation: Conventional anatomy assumed, with the last open disc space designated L5-S1. Alignment:  Normal. Vertebrae: No worrisome osseous lesion, acute fracture or pars defect. There are scattered endplate degenerative changes which are worst on the right at L2-3 and left L3-4. The lumbar pedicles are somewhat short on a congenital basis. The visualized sacroiliac joints appear unremarkable. Conus medullaris: Extends to the L2 level and appears normal. Paraspinal and other soft tissues: No significant paraspinal findings. Disc levels: Sagittal images demonstrate mild disc bulging at T11-12 without resulting cord deformity or foraminal compromise. No significant disc space findings at T12-L1 or L1-2. L2-3: Loss of disc height with annular disc bulging and endplate osteophytes asymmetric to the right. Mild facet and ligamentous hypertrophy. These factors contribute to mild spinal stenosis and mild narrowing of the lateral recesses. L3-4: Loss of disc height with annular disc bulging and endplate osteophytes asymmetric to the left. Asymmetric facet and ligamentous hypertrophy on the left. Mild resulting spinal stenosis with asymmetric mild left lateral recess and moderate left foraminal narrowing. Possible left L3 nerve root encroachment. L4-5: Disc height is relatively maintained.  There is a small central disc protrusion with mild facet and ligamentous hypertrophy. There is mild narrowing of the lateral recesses. The foramina are sufficiently patent. L5-S1: The axial images through this level are motion degraded. There is mild loss of disc height with a disc extrusion in the left subarticular  zone. There is cephalad extension of disc material and/or epidural hemorrhage contributing mass effect on the thecal sac and probable encroachment on the left L5 and S1 nerve root sleeves. There is mild bilateral facet hypertrophy. IMPRESSION: 1. Left-sided disc extrusion at L5-S1 with possible associated epidural hemorrhage. There is mass effect on the thecal sac and probable encroachment on the left L5 and S1 nerve roots. This is the likely source of the patient's acute symptoms. 2. Small central disc protrusion at L4-5 contributing to mild lateral recess narrowing bilaterally. 3. Spondylosis at L3-4 with asymmetric disc bulging and osteophytes on the left contributing to moderate left foraminal narrowing and possible left L3 nerve root encroachment. This does not appear acute. 4. Mild multifactorial spinal stenosis at L2-3. 5. No acute osseous findings. Electronically Signed   By: Richardean Sale M.D.   On: 08/01/2017 17:16    Procedures Procedures (including critical care time)  Medications Ordered in ED Medications  HYDROcodone-acetaminophen (NORCO/VICODIN) 5-325 MG per tablet 2 tablet (has no administration in time range)     Initial Impression / Assessment and Plan / ED Course  I have reviewed the triage vital signs and the nursing notes.  Pertinent labs & imaging results that were available during my care of the patient were reviewed by me and considered in my medical decision making (see chart for details).  Clinical Course as of Aug 02 2354  Tue Aug 01, 2017  2348 Discussed with Dr. Trenton Gammon, neurosurgery.  Recommends follow-up with Dr. Kathyrn Sheriff first thing in the morning.   Given duration of patient's symptoms, no need for emergent transfer tonight.   [CH]    Clinical Course User Index [CH] Lovell Roe, Barbette Hair, MD    Patient presents with persistent sciatic symptoms and left foot drop.  MRI as an outpatient showed herniated disc with epidural hemorrhage.  Symptoms are reported as stable from evaluation yesterday as is my exam.  This was discussed with neurosurgery.  No indication for emergent transfer tonight; however, patient does need very urgent follow-up tomorrow and neurosurgery clinic to determine course of action.  Patient was given pain medication.  Hydrocodone was refilled given acute pain.  He received a steroid injection on Saturday.  Will defer further steroids to neurosurgical evaluation.  Patient and his wife were updated.  After history, exam, and medical workup I feel the patient has been appropriately medically screened and is safe for discharge home. Pertinent diagnoses were discussed with the patient. Patient was given return precautions.   Final Clinical Impressions(s) / ED Diagnoses   Final diagnoses:  Lumbar herniated disc  Left foot drop    ED Discharge Orders        Ordered    HYDROcodone-acetaminophen (NORCO/VICODIN) 5-325 MG tablet  Every 6 hours PRN     08/01/17 2354       Merryl Hacker, MD 08/01/17 2357

## 2017-08-01 NOTE — Patient Instructions (Addendum)
Great to meet you!  Come back in 3-4 weeks to follow up for routine medical care.

## 2017-08-01 NOTE — Discharge Instructions (Addendum)
You were seen today and had an abnormal MRI.  It is very important that you follow-up with neurosurgery tomorrow morning.  If you develop any difficulty peeing, pooping, or increasing weakness you need to be reevaluated immediately.

## 2017-08-01 NOTE — ED Triage Notes (Signed)
Left foot numbness for 1 1/2 weeks.  Had OP MRI today and was sent here to the ER for evaluation of abnormal results.

## 2017-08-01 NOTE — Progress Notes (Signed)
   HPI  Patient presents today here with back pain  Pt has had an eventful weekend.   His original injury happened last Wednesday/20/19.  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  He felt a twinge of pain in his left low back which did not feel very unusual. The next 2 days he developed left low back tightness, which progressed into severe left low back pain with radiation down his left leg on Saturday.  He then pursued care at the emergency room where a stat MRI was attempted.  Patient tried to have the MRI this morning around 3 AM and states that the pain is so severe when he laid down flat but he could not tolerate it.  Patient is now unable to walk because of the left lower extremity numbness and pain. He comes in in a wheelchair today.  He denies saddle anesthesia bowel or bladder dysfunction.  PMH: Type 2 diabetes, asthma, hypertension, sleep apnea Surgical history: Cataract extraction of the left, gastric bypass 2006, hernia repair  ROS: Per HPI  Objective: BP 130/74   Pulse 68   Temp 97.9 F (36.6 C) (Oral)  Gen: NAD, alert, cooperative with exam HEENT: NCAT, EOMI, PERRL CV: RRR, good S1/S2, no murmur Resp: CTABL, no wheezes, non-labored Ext: No edema, warm Neuro: Alert and oriented, positive modified straight leg raise on the left, 2+ patellar tendon and Achilles reflex on the right, 1+ on the left talar tendon and no reflex appreciated on the left Achilles tendon MSK: There is to palpation of left-sided paraspinal muscles in the lumbar area   Assessment and plan:  #Left-sided sciatica Patient with severe left-sided low back pain with symptoms consistent with possible herniated disc Stat MRI 10 mg of oxycodone +10 mg Valium for MRI RTC for routine care in a few weeks.      Orders Placed This Encounter  Procedures  . MR Lumbar Spine Wo Contrast    Standing Status:   Future    Standing Expiration Date:   10/02/2018    Order  Specific Question:   What is the patient's sedation requirement?    Answer:   Anti-anxiety    Order Specific Question:   Does the patient have a pacemaker or implanted devices?    Answer:   No    Order Specific Question:   Preferred imaging location?    Answer:   [facility] (table limit-350lbs)    Order Specific Question:   Radiology Contrast Protocol - do NOT remove file path    Answer:   [email or web address]    Meds ordered this encounter  Medications  . oxyCODONE (OXY IR/ROXICODONE) 5 MG immediate release tablet    Sig: Take 2 tabs 30 min prior to MRI    Dispense:  2 tablet    Refill:  0  . diazepam (VALIUM) 10 MG tablet    Sig: Take 1 pill 30 min to 1 hour prior to MRI    Dispense:  1 tablet    Refill:  0    Laroy Apple, MD [facility] 08/01/2017, 1:57 PM

## 2017-08-02 NOTE — Telephone Encounter (Signed)
Attempted to contact patient - went to ER yesterday.

## 2017-08-03 ENCOUNTER — Telehealth: Payer: Self-pay | Admitting: *Deleted

## 2017-08-03 NOTE — Telephone Encounter (Signed)
Pt seen in our office 08/01/17, will close encounter.

## 2017-08-03 NOTE — Telephone Encounter (Signed)
Spoke with Jacqlyn Larsen at [facility] @ 1pm. Dr Ralene Ok agreed to see pt today as long as pt could be there by 3 and she would contact pt. Zigmund Daniel Rutherford contacted pt @2 :30 and pt stated he had not heard from anyone. I spoke with Jacqlyn Larsen again and she stated that she had made 2 attempts to contact pt and had left a message. Jacqlyn Larsen stated that if pt wasn't at office by 3pm there was no guarantee that he would be seen.I called the pt back, he stated that after he hung up from [place] he saw where he had missed 2 calls, but he would be unable to make the appointment for today. I told him that if he couldn't make it then he would have to keep the appointment for 08/09/17. Pt verbalized understanding. Dr. Wendi Snipes was made aware of the situation

## 2017-08-03 NOTE — Telephone Encounter (Signed)
Incoming call from pt Pt states ED Dr wanted pt seen this week by neurosurgeon Pt states he was given appt for 08/09/2017 Per Dr Wendi Snipes pt needs to be seen this week Please advise

## 2017-08-09 DIAGNOSIS — M5126 Other intervertebral disc displacement, lumbar region: Secondary | ICD-10-CM | POA: Diagnosis not present

## 2017-08-09 DIAGNOSIS — M21372 Foot drop, left foot: Secondary | ICD-10-CM | POA: Diagnosis not present

## 2017-08-14 ENCOUNTER — Encounter: Payer: Self-pay | Admitting: Nurse Practitioner

## 2017-08-14 ENCOUNTER — Ambulatory Visit (INDEPENDENT_AMBULATORY_CARE_PROVIDER_SITE_OTHER): Payer: Medicare HMO | Admitting: Nurse Practitioner

## 2017-08-14 VITALS — BP 126/78 | HR 82 | Temp 97.1°F | Ht 71.0 in | Wt 257.2 lb

## 2017-08-14 DIAGNOSIS — K625 Hemorrhage of anus and rectum: Secondary | ICD-10-CM

## 2017-08-14 DIAGNOSIS — K648 Other hemorrhoids: Secondary | ICD-10-CM | POA: Diagnosis not present

## 2017-08-14 LAB — HEMOGLOBIN, FINGERSTICK: HEMOGLOBIN: 14.5 g/dL (ref 12.6–17.7)

## 2017-08-14 MED ORDER — HYDROCORTISONE ACETATE 25 MG RE SUPP: 25.0000 mg | Freq: Two times a day (BID) | RECTAL | 0 refills | Status: AC

## 2017-08-14 NOTE — Progress Notes (Signed)
   Subjective:    Patient ID: Aaron Shah, male    DOB: 1950-10-31, 67 y.o.   MRN: [id number]  HPI  Patient comes in today c/o rectal bleeding. Bright red. stools normal brownish color. He has history of internal hemorrhoids. He is having back surgery in morning and he wanted to make sure he was okay. He last few bowel movements have been hard to push out.   Review of Systems  Constitutional: Negative.   HENT: Negative.   Respiratory: Negative.   Cardiovascular: Negative.   Gastrointestinal: Positive for blood in stool and constipation. Negative for abdominal pain and diarrhea.  Neurological: Negative for dizziness, weakness and light-headedness.  Psychiatric/Behavioral: Negative.   All other systems reviewed and are negative.      Objective:   Physical Exam  Constitutional: He is oriented to person, place, and time. He appears well-developed and well-nourished. No distress.  Cardiovascular: Normal rate.  Pulmonary/Chest: Effort normal and breath sounds normal.  Abdominal: Soft. Bowel sounds are normal. He exhibits no distension and no mass. There is no tenderness. There is no rebound and no guarding.  Genitourinary:  Genitourinary Comments: No rectal exam done due to history of internal hemorrhoids  Neurological: He is alert and oriented to person, place, and time.  Skin: Skin is warm and dry.  Psychiatric: He has a normal mood and affect. His behavior is normal. Judgment and thought content normal.  BP 126/78   Pulse 82   Temp (!) 97.1 F (36.2 C) (Oral)   Ht 5\' 11"  (1.803 m)   Wt 257 lb 3.2 oz (116.7 kg)   BMI 35.87 kg/m   hgb 14.4    Assessment & Plan:  1. Rectal bleeding - Hemoglobin, fingerstick  2. Internal hemorrhoids - hydrocortisone (ANUSOL-HC) 25 MG suppository; Place 1 suppository (25 mg total) rectally 2 (two) times daily.  Dispense: 12 suppository; Refill: 0  Patient encouraged to do miralax daily after surgery to prevent constipation from pain meds and  to prevent straining. Once surgery  Is complete if rectal bleeding continues will need to do referral to GI. Patient voices understanding.  Mary-Margaret Hassell Done, FNP

## 2017-08-14 NOTE — Patient Instructions (Signed)
Hemorrhoids    Hemorrhoids are swollen veins in and around the rectum or anus. Hemorrhoids can cause pain, itching, or bleeding. Most of the time, they do not cause serious problems. They usually get better with diet changes, lifestyle changes, and other home treatments.  Follow these instructions at home:  Eating and drinking  · Eat foods that have fiber, such as whole grains, beans, nuts, fruits, and vegetables. Ask your doctor about taking products that have added fiber (fiber supplements).  · Drink enough fluid to keep your pee (urine) clear or pale yellow.  For Pain and Swelling  · Take a warm-water bath (sitz bath) for 20 minutes to ease pain. Do this 3-4 times a day.  · If directed, put ice on the painful area. It may be helpful to use ice between your warm baths.  ¨ Put ice in a plastic bag.  ¨ Place a towel between your skin and the bag.  ¨ Leave the ice on for 20 minutes, 2-3 times a day.  General instructions  · Take over-the-counter and prescription medicines only as told by your doctor.  ¨ Medicated creams and medicines that are inserted into the anus (suppositories) may be used or applied as told.  · Exercise often.  · Go to the bathroom when you have the urge to poop (to have a bowel movement). Do not wait.  · Avoid pushing too hard (straining) when you poop.  · Keep the butt area dry and clean. Use wet toilet paper or moist paper towels.  · Do not sit on the toilet for a long time.  Contact a doctor if:  · You have any of these:  ¨ Pain and swelling that do not get better with treatment or medicine.  ¨ Bleeding that will not stop.  ¨ Trouble pooping or you cannot poop.  ¨ Pain or swelling outside the area of the hemorrhoids.  This information is not intended to replace advice given to you by your health care provider. Make sure you discuss any questions you have with your health care provider.  Document Released: 02/02/2008 Document Revised: 10/01/2015 Document Reviewed: 01/07/2015  Elsevier  Interactive Patient Education © 2018 Elsevier Inc.   

## 2017-08-15 DIAGNOSIS — M5127 Other intervertebral disc displacement, lumbosacral region: Secondary | ICD-10-CM | POA: Diagnosis not present

## 2017-08-17 ENCOUNTER — Telehealth: Payer: Self-pay | Admitting: Pediatrics

## 2017-08-18 ENCOUNTER — Telehealth: Payer: Self-pay | Admitting: Pediatrics

## 2017-08-18 NOTE — Telephone Encounter (Signed)
Pt is coming in for apt on 4/13

## 2017-08-18 NOTE — Telephone Encounter (Signed)
Pt is coming in for an apt 4/13

## 2017-08-19 ENCOUNTER — Ambulatory Visit (INDEPENDENT_AMBULATORY_CARE_PROVIDER_SITE_OTHER): Payer: Medicare HMO | Admitting: Family Medicine

## 2017-08-19 VITALS — BP 110/72 | HR 77 | Temp 97.4°F | Ht 71.0 in | Wt 255.0 lb

## 2017-08-19 DIAGNOSIS — R339 Retention of urine, unspecified: Secondary | ICD-10-CM | POA: Diagnosis not present

## 2017-08-19 DIAGNOSIS — R300 Dysuria: Secondary | ICD-10-CM | POA: Diagnosis not present

## 2017-08-19 MED ORDER — SULFAMETHOXAZOLE-TRIMETHOPRIM 800-160 MG PO TABS: 1.0000 | ORAL_TABLET | Freq: Two times a day (BID) | ORAL | 0 refills | Status: DC

## 2017-08-19 NOTE — Progress Notes (Signed)
BP 110/72 (BP Location: Right Arm, Patient Position: Sitting, Cuff Size: Large)   Pulse 77   Temp (!) 97.4 F (36.3 C)   Ht 5\' 11"  (1.803 m)   Wt 255 lb (115.7 kg)   BMI 35.57 kg/m    Subjective:    Patient ID: Aaron Shah, male    DOB: May 19, 1950, 67 y.o.   MRN: [id number]  HPI: Aaron Shah is a 67 y.o. male presenting on 08/19/2017 for Urinary Tract Infection (dysuria since having surgery 4 days ago. Feels like he isn't completely emptying his bladder.. Retained urine after surgery. )   HPI Dysuria and frequency and difficulty urinating Patient has been having dysuria and frequency and urgency and difficulty urinating.  He had a surgery 4 days ago on his spine as an outpatient procedure and since then he has been having some issues with the urination.  He does not know if he had a catheter not but most likely he did because he was under full sedation.  He denies any fevers or chills or flank pain but just has been having a lot of issues with the urination and frequency.  He denies any blood in his urine.  He is concerned about his prostate being a possible issue.  Relevant past medical, surgical, family and social history reviewed and updated as indicated. Interim medical history since our last visit reviewed. Allergies and medications reviewed and updated.  Review of Systems  Constitutional: Negative for chills and fever.  Eyes: Negative for discharge.  Respiratory: Negative for shortness of breath and wheezing.   Cardiovascular: Negative for chest pain and leg swelling.  Gastrointestinal: Positive for abdominal pain.  Genitourinary: Positive for dysuria, frequency, hematuria and urgency.  Musculoskeletal: Negative for back pain and gait problem.  Skin: Negative for rash.  All other systems reviewed and are negative.   Per HPI unless specifically indicated above   Allergies as of 08/19/2017   No Known Allergies     Medication List        Accurate as of 08/19/17  11:36 AM. Always use your most recent med list.          albuterol 108 (90 Base) MCG/ACT inhaler Commonly known as:  PROVENTIL HFA;VENTOLIN HFA Inhale 1 puff into the lungs every 6 (six) hours as needed for wheezing or shortness of breath.   colchicine 0.6 MG tablet Take 0.12 mg by mouth every evening.   cyclobenzaprine 10 MG tablet Commonly known as:  FLEXERIL   hydrocortisone 25 MG suppository Commonly known as:  ANUSOL-HC Place 1 suppository (25 mg total) rectally 2 (two) times daily.   ibuprofen 400 MG tablet Commonly known as:  ADVIL,MOTRIN Take 400 mg by mouth every 6 (six) hours as needed for mild pain or moderate pain.   lisinopril 10 MG tablet Commonly known as:  PRINIVIL,ZESTRIL Take 10 mg by mouth daily.   metFORMIN 500 MG tablet Commonly known as:  GLUCOPHAGE Take 250 mg by mouth 2 (two) times daily with a meal.   multivitamin with minerals Tabs tablet Take 1 tablet by mouth daily.   oxyCODONE 5 MG immediate release tablet Commonly known as:  Oxy IR/ROXICODONE Take 2 tabs 30 min prior to MRI   oxyCODONE-acetaminophen 5-325 MG tablet Commonly known as:  PERCOCET/ROXICET   pyridOXINE 100 MG tablet Commonly known as:  VITAMIN B-6 Take 100 mg by mouth daily.   sulfamethoxazole-trimethoprim 800-160 MG tablet Commonly known as:  BACTRIM DS,SEPTRA DS Take 1 tablet by mouth 2 (two)  times daily.   testosterone cypionate 200 MG/ML injection Commonly known as:  DEPOTESTOSTERONE CYPIONATE Inject 200 mg into the muscle every 14 (fourteen) days. 5ml. q 2 weeks   vitamin B-12 1000 MCG tablet Commonly known as:  CYANOCOBALAMIN Take 1,000 mcg by mouth daily.          Objective:    BP 110/72 (BP Location: Right Arm, Patient Position: Sitting, Cuff Size: Large)   Pulse 77   Temp (!) 97.4 F (36.3 C)   Ht 5\' 11"  (1.803 m)   Wt 255 lb (115.7 kg)   BMI 35.57 kg/m   Wt Readings from Last 3 Encounters:  08/19/17 255 lb (115.7 kg)  08/14/17 257 lb 3.2 oz  (116.7 kg)  08/01/17 268 lb (121.6 kg)    Physical Exam  Constitutional: He is oriented to person, place, and time. He appears well-developed and well-nourished. No distress.  Eyes: Pupils are equal, round, and reactive to light. Conjunctivae and EOM are normal. Right eye exhibits no discharge. No scleral icterus.  Neck: Neck supple. No thyromegaly present.  Cardiovascular: Normal rate, regular rhythm, normal heart sounds and intact distal pulses.  No murmur heard. Pulmonary/Chest: Effort normal and breath sounds normal. No respiratory distress. He has no wheezes.  Abdominal: Soft. Bowel sounds are normal. He exhibits no distension. There is no tenderness. There is no guarding.  Genitourinary: Prostate normal.  Musculoskeletal: Normal range of motion. He exhibits no edema.  Lymphadenopathy:    He has no cervical adenopathy.  Neurological: He is alert and oriented to person, place, and time. Coordination normal.  Skin: Skin is warm and dry. No rash noted. He is not diaphoretic.  Psychiatric: He has a normal mood and affect. His behavior is normal.  Nursing note and vitals reviewed.  Urine dip 2+ leukocytes     Assessment & Plan:   Problem List Items Addressed This Visit    None    Visit Diagnoses    Dysuria    -  Primary   Relevant Medications   sulfamethoxazole-trimethoprim (BACTRIM DS,SEPTRA DS) 800-160 MG tablet   Other Relevant Orders   Urine Culture   Urinalysis   Urinary retention       Patient is still passing urine but feels like he is not passing as much since his surgery last week, if still having issues on Monday come back for ultrasound   Relevant Medications   sulfamethoxazole-trimethoprim (BACTRIM DS,SEPTRA DS) 800-160 MG tablet       Follow up plan: Return if symptoms worsen or fail to improve.  Counseling provided for all of the vaccine components Orders Placed This Encounter  Procedures  . Urine Culture  . Urinalysis    Caryl Pina, MD [facility] 08/19/2017, 11:36 AM

## 2017-08-20 LAB — URINE CULTURE: ORGANISM ID, BACTERIA: NO GROWTH

## 2017-08-21 LAB — URINALYSIS
BILIRUBIN UA: NEGATIVE
GLUCOSE, UA: NEGATIVE
Ketones, UA: NEGATIVE
Nitrite, UA: NEGATIVE
PROTEIN UA: NEGATIVE
RBC, UA: NEGATIVE
Specific Gravity, UA: 1.015 (ref 1.005–1.030)
Urobilinogen, Ur: 0.2 mg/dL (ref 0.2–1.0)
pH, UA: 5.5 (ref 5.0–7.5)

## 2017-09-13 ENCOUNTER — Encounter: Payer: Self-pay | Admitting: Physical Therapy

## 2017-09-13 ENCOUNTER — Ambulatory Visit: Payer: Medicare HMO | Attending: Neurosurgery | Admitting: Physical Therapy

## 2017-09-13 ENCOUNTER — Other Ambulatory Visit: Payer: Self-pay

## 2017-09-13 DIAGNOSIS — M6281 Muscle weakness (generalized): Secondary | ICD-10-CM | POA: Diagnosis not present

## 2017-09-13 DIAGNOSIS — M21372 Foot drop, left foot: Secondary | ICD-10-CM

## 2017-09-13 DIAGNOSIS — M5442 Lumbago with sciatica, left side: Secondary | ICD-10-CM

## 2017-09-13 NOTE — Therapy (Signed)
[facility] [place], [name], [address] Phone: [phone]   Fax:  [phone]  Physical Therapy Evaluation  Patient Details  Name: Aaron Shah MRN: [id number] Date of Birth: 12/18/1950 Referring Provider: Consuella Lose MD.   Encounter Date: 09/13/2017  PT End of Session - 09/13/17 1448    Visit Number  1    Number of Visits  12    Date for PT Re-Evaluation  10/25/17    PT Start Time  0107    PT Stop Time  0201    PT Time Calculation (min)  54 min       Past Medical History:  Diagnosis Date  . Arthritis    entire spine, C4,5,6- HNP, seeing MD at [facility]   . Asthma    h/o- uses Proventil when he has a flare   . Cataract   . Concussion 05/2013   resulted from a fall, had been rx for depression after that but developed feelings of homicide & suicide, med. was d/c'd  . Diabetes mellitus without complication ([place]) 1829   [facility] med.   . Diabetes mellitus without complication ([place])   . H/O exercise stress test 2016   pt,. reports that it was done thru the [place] in [place]  . Hypertension   . Pneumonia 1970's   [place]osp. [place], pt. reports that he is prone to getting bronchitis   . Sleep apnea 2016   CPAP machine but not using at present, test done in [place] , will have a new study here in [place]     Past Surgical History:  Procedure Laterality Date  . CATARACT EXTRACTION W/PHACO Left 08/19/2015   Procedure: CATARACT EXTRACTION PHACO AND INTRAOCULAR LENS PLACEMENT (IOC) LEFT EYE;  Surgeon: Marylynn Pearson, MD;  Location: [place];  Service: Ophthalmology;  Laterality: Left;  . EYE SURGERY     glass removed fr. R eye   . GASTRIC BYPASS  12/2004   [place], [place], weight was 400lbs. prior to   . HERNIA REPAIR     inguinal- L   . IRRIGATION AND DEBRIDEMENT SEBACEOUS CYST Right    arm- upper, cysts both removed  . LEFT HEART CATH AND CORONARY ANGIOGRAPHY N/A 02/15/2017   Procedure: LEFT HEART  CATH AND CORONARY ANGIOGRAPHY;  Surgeon: Leonie Man, MD;  Location: [facility];  Service: Cardiovascular;  Laterality: N/A;  . TESTICLE REMOVAL  1960's, 2001   both removed   . TONSILLECTOMY      There were no vitals filed for this visit.   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  As he stepped down his foot slipped off the step (no anti-skid plate in place) and landed very hard on his LE's especially his left LE.  He felt pain in his low back at that time.  A couple of days later he began to experience intense left LE pain and numbness beginning to set into his left foot and an inability to move the foot.  He prsented to an Urgent care and then an ED and then to [facility].  He tried to tolerate having an MRI but was in excuriating pain at that time.  He states that is was "the worst pain of my life."  He eventually went through the MRI and was found to be in need of surgery  ASAP.  He underwent a hemilaminectomy with nerve decompression due to a herniated disc at L5-S1 on 08/15/17.  He reports the surgery has helped a great deal with regards to his pain-level but still has essentially no foot function and has "dead areas" still especially on the bottom of his left foot.  He states his surgeron can not be certain that he will regain function in his foot.   His main goal for physical therapy is to regain function in his left foot/ankle with hopes of returning to work.      Pertinent History  Concussion in past.  2 recent falls.    Limitations  Walking    How long can you walk comfortably?  Short distances with a FWW.    Diagnostic tests  X-ray and MRI.    Patient Stated Goals  See above.    Currently in Pain?  Yes    Pain Score  3     Pain Location  Back    Pain Orientation  Right;Left    Pain Descriptors / Indicators  Aching    Pain Type  Acute pain    Pain Frequency   Intermittent         [facility] PT Assessment - 09/13/17 0001      Assessment   Medical Diagnosis  Left foot drop.    Referring Provider  Consuella Lose MD.    Onset Date/Surgical Date  -- 07/26/17 (DOI).      Precautions   Precautions  Fall      Restrictions   Weight Bearing Restrictions  No      Balance Screen   Has the patient fallen in the past 6 months  Yes    How many times?  -- 1-2.    Has the patient had a decrease in activity level because of a fear of falling?   No    Is the patient reluctant to leave their home because of a fear of falling?   No      Home Environment   Living Environment  Private residence      Prior Function   Level of Independence  Independent      Observation/Other Assessments   Observations  Lumbar incision looks to be healing well.      Posture/Postural Control   Posture Comments  Stands at walker in some trunk flexion.      ROM / Strength   AROM / PROM / Strength  AROM;Strength      AROM   Overall AROM Comments  Essentially no left ankle inversion, eversion or dorsiflexion.  He has full left ankle plantarflexion.      Strength   Overall Strength Comments  Trace- muscle grade for left ankle inversion, eversion and dorsiflexion.  Left knee extension= 4+/5 and left knee flexion= 4-/5.      Palpation   Palpation comment  Tender on either side of his lumbar incisional site right > left today.      Special Tests   Other special tests  Absent left LE DTR's.      Ambulation/Gait   Gait Comments  Steppage gait due to left foot drop with patient using a FWW.                Objective measurements completed on examination: See above findings.      [facility] Adult PT Treatment/Exercise - 09/13/17 0001      Modalities   Modalities  Administrator  Stimulation   Electrical Stimulation Location  Bilateral low back.    Electrical Stimulation Action  Pre-mod.    Electrical Stimulation Parameters  80-150 Hz x  15 minutes.                  PT Long Term Goals - 09/13/17 1525      PT LONG TERM GOAL #1   Title  Independent with a HEP.    Time  6    Period  Weeks    Status  New      PT LONG TERM GOAL #2   Title  Restore full left ankle AROM.    Time  6    Period  Weeks    Status  New      PT LONG TERM GOAL #3   Title  Left ankle strength to 4+/5.    Time  6    Period  Weeks    Status  New      PT LONG TERM GOAL #4   Title  Walk without an assistive device and normal gait pattern.    Time  6    Period  Weeks    Status  New      PT LONG TERM GOAL #5   Title  Perform ADL's with pain not > 2-3/10.    Time  6    Period  Weeks    Status  New             Plan - 09/13/17 1517    Clinical Impression Statement  The patient sustained a severe low back injury on the job and underwent lumbar surgery on 08/15/17.  The injury caused left LE nerve damage due to a herniated disc that has left him with left foot drop.  He has essentially no left ankle movement with the exception of plantarflexion.  He has to use a FWW for safe ambulation at this time.  Plan to proceed with skilled physical therapy intervention with hopes of restoring left ankle motion and strength.    History and Personal Factors relevant to plan of care:  Concussion.    Clinical Presentation  Unstable    Clinical Decision Making  Moderate    Rehab Potential  Fair    Clinical Impairments Affecting Rehab Potential  Uncertain rehab potential due to nerve damage from work injury.    PT Frequency  2x / week    PT Duration  6 weeks    PT Treatment/Interventions  ADLs/Self Care Home Management;Electrical Stimulation;Ultrasound;Functional mobility training;Therapeutic activities;Therapeutic exercise;Balance training;Neuromuscular re-education;Patient/family education;Passive range of motion;Manual techniques    PT Next Visit Plan  Please follow laminecetomy protocol; try e'stim to low back and left lower leg to restore  left ankle function.  Seated BAPS.    Consulted and Agree with Plan of Care  Patient       Patient will benefit from skilled therapeutic intervention in order to improve the following deficits and impairments:  Abnormal gait, Decreased activity tolerance, Decreased strength, Pain  Visit Diagnosis: Acute bilateral low back pain with left-sided sciatica - Plan: PT plan of care cert/re-cert  Foot drop, left - Plan: PT plan of care cert/re-cert  Muscle weakness (generalized) - Plan: PT plan of care cert/re-cert     Problem List Patient Active Problem List   Diagnosis Date Noted  . Dyspnea on exertion 02/09/2017  . Diabetes mellitus without complication ([place]) 51/76/1607  . History of gastric bypass 02/07/2017  . OSA (obstructive sleep apnea) 02/07/2017  .  Obesity (BMI 30-39.9) 02/07/2017  . Hypogonadism in male 02/07/2017    Niasia Lanphear, [place] MPT 09/13/2017, 3:29 PM  [facility] [address] [place], [name], [address] Phone: [phone]   Fax:  [phone]  Name: Aaron Shah MRN: [id number] Date of Birth: 04/07/1951

## 2017-09-15 ENCOUNTER — Encounter: Payer: Self-pay | Admitting: Physical Therapy

## 2017-09-15 ENCOUNTER — Ambulatory Visit: Payer: Medicare HMO | Admitting: Physical Therapy

## 2017-09-15 DIAGNOSIS — M21372 Foot drop, left foot: Secondary | ICD-10-CM

## 2017-09-15 DIAGNOSIS — M6281 Muscle weakness (generalized): Secondary | ICD-10-CM | POA: Diagnosis not present

## 2017-09-15 DIAGNOSIS — M5442 Lumbago with sciatica, left side: Secondary | ICD-10-CM

## 2017-09-15 NOTE — Therapy (Signed)
[facility] [place], [name], [address] Phone: [phone]   Fax:  [phone]  Physical Therapy Treatment  Patient Details  Name: Aaron Shah MRN: [id number] Date of Birth: 1951-02-23 Referring Provider: Consuella Lose MD.   Encounter Date: 09/15/2017  PT End of Session - 09/15/17 1029    Visit Number  2    Number of Visits  12    Date for PT Re-Evaluation  10/25/17    PT Start Time  0945    Activity Tolerance  Patient tolerated treatment well    Behavior During Therapy  [facility] for tasks assessed/performed       Past Medical History:  Diagnosis Date  . Arthritis    entire spine, C4,5,6- HNP, seeing MD at [facility]   . Asthma    h/o- uses Proventil when he has a flare   . Cataract   . Concussion 05/2013   resulted from a fall, had been rx for depression after that but developed feelings of homicide & suicide, med. was d/c'd  . Diabetes mellitus without complication ([place]) 4481   oral med.   . Diabetes mellitus without complication ([place])   . H/O exercise stress test 2016   pt,. reports that it was done thru the [place] in [place]  . Hypertension   . Pneumonia 1970's   hosp. in [employer], pt. reports that he is prone to getting bronchitis   . Sleep apnea 2016   CPAP machine but not using at present, test done in [place] , will have a new study here in [place]     Past Surgical History:  Procedure Laterality Date  . CATARACT EXTRACTION W/PHACO Left 08/19/2015   Procedure: CATARACT EXTRACTION PHACO AND INTRAOCULAR LENS PLACEMENT (IOC) LEFT EYE;  Surgeon: Marylynn Pearson, MD;  Location: [place];  Service: Ophthalmology;  Laterality: Left;  . EYE SURGERY     glass removed fr. R eye   . GASTRIC BYPASS  12/2004   [place], [place], weight was 400lbs. prior to   . HERNIA REPAIR     inguinal- L   . IRRIGATION AND DEBRIDEMENT SEBACEOUS CYST Right    arm- upper, cysts both removed  . LEFT HEART CATH AND  CORONARY ANGIOGRAPHY N/A 02/15/2017   Procedure: LEFT HEART CATH AND CORONARY ANGIOGRAPHY;  Surgeon: Leonie Man, MD;  Location: [place] [facility];  Service: Cardiovascular;  Laterality: N/A;  . TESTICLE REMOVAL  1960's, 2001   both removed   . TONSILLECTOMY      There were no vitals filed for this visit.  Subjective Assessment - 09/15/17 1020    Subjective  That treatment helped my back feel better.  I feel some axhy pain in my left calf today.    Currently in Pain?  Yes    Pain Score  7     Pain Location  Leg    Pain Orientation  Left    Pain Descriptors / Indicators  Aching    Pain Frequency  Intermittent                       [facility] Adult PT Treatment/Exercise - 09/15/17 0001      Exercises   Exercises  Knee/Hip;Ankle      Knee/Hip Exercises: Aerobic   Nustep  Level 2 to 16 minutes moving forward x 1 to increase dorsiflexion.      Acupuncturist Location  Bilateral low back  Electrical Stimulation Action  IFC at 80-100 Hz with carrier frequency on 4000 Hz and 100% scan x 20 minutes.      Ankle Exercises: Seated   Other Seated Ankle Exercises  Seated level 1 BAPS x 4 minutes f/b seated Rockerboard into D/P flexion and inv/eversion x 4 minutes for neuro re-education.                  PT Long Term Goals - 09/13/17 1525      PT LONG TERM GOAL #1   Title  Independent with a HEP.    Time  6    Period  Weeks    Status  New      PT LONG TERM GOAL #2   Title  Restore full left ankle AROM.    Time  6    Period  Weeks    Status  New      PT LONG TERM GOAL #3   Title  Left ankle strength to 4+/5.    Time  6    Period  Weeks    Status  New      PT LONG TERM GOAL #4   Title  Walk without an assistive device and normal gait pattern.    Time  6    Period  Weeks    Status  New      PT LONG TERM GOAL #5   Title  Perform ADL's with pain not > 2-3/10.    Time  6    Period  Weeks    Status  New             Plan - 09/15/17 1030    Clinical Impression Statement  The patient did very well today with some activation of left anterior tib observed today.    PT Treatment/Interventions  ADLs/Self Care Home Management;Electrical Stimulation;Ultrasound;Functional mobility training;Therapeutic activities;Therapeutic exercise;Balance training;Neuromuscular re-education;Patient/family education;Passive range of motion;Manual techniques    PT Next Visit Plan  Please follow laminecetomy protocol; try e'stim to low back and left lower leg to restore left ankle function.  Seated BAPS.    Consulted and Agree with Plan of Care  Patient       Patient will benefit from skilled therapeutic intervention in order to improve the following deficits and impairments:     Visit Diagnosis: Acute bilateral low back pain with left-sided sciatica  Foot drop, left  Muscle weakness (generalized)     Problem List Patient Active Problem List   Diagnosis Date Noted  . Dyspnea on exertion 02/09/2017  . Diabetes mellitus without complication ([place]) 28/31/5176  . History of gastric bypass 02/07/2017  . OSA (obstructive sleep apnea) 02/07/2017  . Obesity (BMI 30-39.9) 02/07/2017  . Hypogonadism in male 02/07/2017    APPLEGATE, [place] MPT 09/15/2017, 11:02 AM  [facility] [place], [name], [address] Phone: [phone]   Fax:  [phone]  Name: Jasson Siegmann MRN: [id number] Date of Birth: 1950/10/25

## 2017-09-19 ENCOUNTER — Ambulatory Visit: Payer: Medicare HMO | Admitting: *Deleted

## 2017-09-19 DIAGNOSIS — M21372 Foot drop, left foot: Secondary | ICD-10-CM

## 2017-09-19 DIAGNOSIS — M5442 Lumbago with sciatica, left side: Secondary | ICD-10-CM | POA: Diagnosis not present

## 2017-09-19 DIAGNOSIS — M6281 Muscle weakness (generalized): Secondary | ICD-10-CM | POA: Diagnosis not present

## 2017-09-19 NOTE — Therapy (Signed)
[name], [name], [address] Phone: [phone]   Fax:  [phone]  Physical Therapy Treatment  Patient Details  Name: Aaron Shah MRN: [id number] Date of Birth: 04/08/1951 Referring Provider: Consuella Lose MD.   Encounter Date: 09/19/2017  PT End of Session - 09/19/17 1044    Visit Number  3    Number of Visits  12    Date for PT Re-Evaluation  10/25/17    PT Start Time  1034    PT Stop Time  1124    PT Time Calculation (min)  50 min       Past Medical History:  Diagnosis Date  . Arthritis    entire spine, C4,5,6- HNP, seeing MD at [facility]   . Asthma    h/o- uses Proventil when he has a flare   . Cataract   . Concussion 05/2013   resulted from a fall, had been rx for depression after that but developed feelings of homicide & suicide, med. was d/c'd  . Diabetes mellitus without complication ([place]) 1607   oral med.   . Diabetes mellitus without complication ([place])   . H/O exercise stress test 2016   pt,. reports that it was done thru the [place] in [place]  . Hypertension   . Pneumonia 1970's   hosp. in [employer], pt. reports that he is prone to getting bronchitis   . Sleep apnea 2016   CPAP machine but not using at present, test done in [place] , will have a new study here in [place]     Past Surgical History:  Procedure Laterality Date  . CATARACT EXTRACTION W/PHACO Left 08/19/2015   Procedure: CATARACT EXTRACTION PHACO AND INTRAOCULAR LENS PLACEMENT (IOC) LEFT EYE;  Surgeon: Marylynn Pearson, MD;  Location: [place];  Service: Ophthalmology;  Laterality: Left;  . EYE SURGERY     glass removed fr. R eye   . GASTRIC BYPASS  12/2004   [place], [place], weight was 400lbs. prior to   . HERNIA REPAIR     inguinal- L   . IRRIGATION AND DEBRIDEMENT SEBACEOUS CYST Right    arm- upper, cysts both removed  . LEFT HEART CATH AND CORONARY ANGIOGRAPHY N/A 02/15/2017   Procedure: LEFT HEART  CATH AND CORONARY ANGIOGRAPHY;  Surgeon: Leonie Man, MD;  Location: [place] [facility];  Service: Cardiovascular;  Laterality: N/A;  . TESTICLE REMOVAL  1960's, 2001   both removed   . TONSILLECTOMY      There were no vitals filed for this visit.  Subjective Assessment - 09/19/17 1051    Subjective  That treatment helped my back feel better.  I feel some axhy pain in my left calf today.    Pertinent History  Concussion in past.  2 recent falls.    Limitations  Walking    How long can you walk comfortably?  Short distances with a FWW.    Diagnostic tests  X-ray and MRI.    Patient Stated Goals  See above.    Currently in Pain?  Yes    Pain Score  7     Pain Location  Leg    Pain Orientation  Left    Pain Descriptors / Indicators  Aching    Pain Type  Acute pain    Pain Frequency  Intermittent                       [facility] Adult  PT Treatment/Exercise - 09/19/17 0001      Ambulation/Gait   Gait Comments  Steppage gait due to left foot drop with patient using a FWW.      Exercises   Exercises  Knee/Hip;Ankle      Knee/Hip Exercises: Aerobic   Nustep  Level 3 to 16 minutes moving forward x 1 to increase dorsiflexion.      Modalities   Modalities  Electrical engineer Stimulation Location  Bilateral low back IFC x 15 mins 80-150hz     Electrical Stimulation Action  In sitting    Electrical Stimulation Goals  Pain      Ankle Exercises: Seated   Other Seated Ankle Exercises  Seated level 1 BAPS x 5 minutes f/b seated Rockerboard into D/P flexion and inv/eversion x 5 minutes for neuro re-education.                  PT Long Term Goals - 09/13/17 1525      PT LONG TERM GOAL #1   Title  Independent with a HEP.    Time  6    Period  Weeks    Status  New      PT LONG TERM GOAL #2   Title  Restore full left ankle AROM.    Time  6    Period  Weeks    Status  New      PT LONG TERM GOAL #3   Title   Left ankle strength to 4+/5.    Time  6    Period  Weeks    Status  New      PT LONG TERM GOAL #4   Title  Walk without an assistive device and normal gait pattern.    Time  6    Period  Weeks    Status  New      PT LONG TERM GOAL #5   Title  Perform ADL's with pain not > 2-3/10.    Time  6    Period  Weeks    Status  New            Plan - 09/19/17 1149    Clinical Impression Statement  Pt arrived doing fairly well. He reports that his LT foot is doing about the same with minimal movement other than PF. He did well therex and nueromuscular re-ed. Pt was also instructed in Draw-in for core activation while in sitting. Modalities performed  with Pt in sitting position with normal response.    Clinical Presentation  Unstable    Clinical Decision Making  Moderate    Rehab Potential  Fair    Clinical Impairments Affecting Rehab Potential  Uncertain rehab potential due to nerve damage from work injury.    PT Frequency  2x / week    PT Duration  6 weeks    PT Treatment/Interventions  ADLs/Self Care Home Management;Electrical Stimulation;Ultrasound;Functional mobility training;Therapeutic activities;Therapeutic exercise;Balance training;Neuromuscular re-education;Patient/family education;Passive range of motion;Manual techniques    PT Next Visit Plan  Please follow laminecetomy protocol; try e'stim to low back and left lower leg to restore left ankle function.  Seated BAPS.    Consulted and Agree with Plan of Care  Patient       Patient will benefit from skilled therapeutic intervention in order to improve the following deficits and impairments:  Abnormal gait, Decreased activity tolerance, Decreased strength, Pain  Visit Diagnosis: Acute bilateral low back pain with left-sided  sciatica  Foot drop, left  Muscle weakness (generalized)     Problem List Patient Active Problem List   Diagnosis Date Noted  . Dyspnea on exertion 02/09/2017  . Diabetes mellitus without  complication ([place]) 45/80/9983  . History of gastric bypass 02/07/2017  . OSA (obstructive sleep apnea) 02/07/2017  . Obesity (BMI 30-39.9) 02/07/2017  . Hypogonadism in male 02/07/2017    RAMSEUR,CHRIS, PTA 09/19/2017, 11:59 AM  [facility] [place], [name], [address] Phone: [phone]   Fax:  [phone]  Name: Aaron Shah MRN: [id number] Date of Birth: 28-Mar-1951

## 2017-09-22 ENCOUNTER — Ambulatory Visit: Payer: Medicare HMO | Admitting: *Deleted

## 2017-09-22 DIAGNOSIS — M21372 Foot drop, left foot: Secondary | ICD-10-CM | POA: Diagnosis not present

## 2017-09-22 DIAGNOSIS — M5442 Lumbago with sciatica, left side: Secondary | ICD-10-CM

## 2017-09-22 DIAGNOSIS — M6281 Muscle weakness (generalized): Secondary | ICD-10-CM | POA: Diagnosis not present

## 2017-09-22 NOTE — Therapy (Signed)
[facility] [place], [name], [address] Phone: [phone]   Fax:  [phone]  Physical Therapy Treatment  Patient Details  Name: Aaron Shah MRN: [id number] Date of Birth: [birth year] Referring Provider: Consuella Lose MD.   Encounter Date: 09/22/2017  PT End of Session - 09/22/17 1124    Visit Number  4    Number of Visits  12    Date for PT Re-Evaluation  10/25/17    PT Start Time  1030    PT Stop Time  1125    PT Time Calculation (min)  55 min       Past Medical History:  Diagnosis Date  . Arthritis    entire spine, C4,5,6- HNP, seeing MD at [facility]   . Asthma    h/o- uses Proventil when he has a flare   . Cataract   . Concussion 05/2013   resulted from a fall, had been rx for depression after that but developed feelings of homicide & suicide, med. was d/c'd  . Diabetes mellitus without complication ([place]) 5625   oral med.   . Diabetes mellitus without complication ([place])   . H/O exercise stress test 2016   pt,. reports that it was done thru the [place] in [place]  . Hypertension   . Pneumonia 1970's   hosp. in [employer], pt. reports that he is prone to getting bronchitis   . Sleep apnea 2016   CPAP machine but not using at present, test done in [place] , will have a new study here in [place]     Past Surgical History:  Procedure Laterality Date  . CATARACT EXTRACTION W/PHACO Left 08/19/2015   Procedure: CATARACT EXTRACTION PHACO AND INTRAOCULAR LENS PLACEMENT (IOC) LEFT EYE;  Surgeon: Marylynn Pearson, MD;  Location: [place];  Service: Ophthalmology;  Laterality: Left;  . EYE SURGERY     glass removed fr. R eye   . GASTRIC BYPASS  12/2004   [place], [place], weight was 400lbs. prior to   . HERNIA REPAIR     inguinal- L   . IRRIGATION AND DEBRIDEMENT SEBACEOUS CYST Right    arm- upper, cysts both removed  . LEFT HEART CATH AND CORONARY ANGIOGRAPHY N/A 02/15/2017   Procedure: LEFT HEART  CATH AND CORONARY ANGIOGRAPHY;  Surgeon: Leonie Man, MD;  Location: [place] [facility];  Service: Cardiovascular;  Laterality: N/A;  . TESTICLE REMOVAL  1960's, 2001   both removed   . TONSILLECTOMY      There were no vitals filed for this visit.  Subjective Assessment - 09/22/17 1059    Subjective  That treatment helped my back feel better.  I feel some soreness in my calf    Pertinent History  Concussion in past.  2 recent falls.    Limitations  Walking    How long can you walk comfortably?  Short distances with a FWW.    Diagnostic tests  X-ray and MRI.    Patient Stated Goals  See above.    Currently in Pain?  Yes    Pain Location  Leg    Pain Descriptors / Indicators  Aching    Pain Type  Acute pain    Pain Frequency  Intermittent                       [facility] Adult PT Treatment/Exercise - 09/22/17 0001      Exercises   Exercises  Knee/Hip;Ankle  Knee/Hip Exercises: Aerobic   Nustep  Level 3 to 16 minutes moving forward x 1 to increase dorsiflexion.      Modalities   Modalities  Electrical engineer Stimulation Location  Bilateral low back IFC x 15 mins 80-150hz , VMS x 15 mins to RT AT 10 secs on/off at the same time    Electrical Stimulation Action  in sitting     Electrical Stimulation Goals  Tone;Pain      Ankle Exercises: Standing   Rocker Board  -- 4 mins PF /DF, 4 mins balance                  PT Long Term Goals - 09/13/17 1525      PT LONG TERM GOAL #1   Title  Independent with a HEP.    Time  6    Period  Weeks    Status  New      PT LONG TERM GOAL #2   Title  Restore full left ankle AROM.    Time  6    Period  Weeks    Status  New      PT LONG TERM GOAL #3   Title  Left ankle strength to 4+/5.    Time  6    Period  Weeks    Status  New      PT LONG TERM GOAL #4   Title  Walk without an assistive device and normal gait pattern.    Time  6    Period  Weeks     Status  New      PT LONG TERM GOAL #5   Title  Perform ADL's with pain not > 2-3/10.    Time  6    Period  Weeks    Status  New            Plan - 09/22/17 1128    Clinical Impression Statement  Pt arrived today doing fairly well, but reports almost falling at home due to foot drop. Pt did well with Rx with focus on neuro re-ed for RT foot drop deficit. VMS was used to facilitate RT AT while Pt. attempts DF on rocker board in sitting..    Rehab Potential  Fair    Clinical Impairments Affecting Rehab Potential  Uncertain rehab potential due to nerve damage from work injury.    PT Frequency  2x / week    PT Duration  6 weeks    PT Treatment/Interventions  ADLs/Self Care Home Management;Electrical Stimulation;Ultrasound;Functional mobility training;Therapeutic activities;Therapeutic exercise;Balance training;Neuromuscular re-education;Patient/family education;Passive range of motion;Manual techniques    PT Next Visit Plan  Please follow laminecetomy protocol; try e'stim to low back and left lower leg to restore left ankle function.  Seated BAPS.    Consulted and Agree with Plan of Care  Patient       Patient will benefit from skilled therapeutic intervention in order to improve the following deficits and impairments:  Abnormal gait, Decreased activity tolerance, Decreased strength, Pain  Visit Diagnosis: Acute bilateral low back pain with left-sided sciatica  Foot drop, left  Muscle weakness (generalized)     Problem List Patient Active Problem List   Diagnosis Date Noted  . Dyspnea on exertion 02/09/2017  . Diabetes mellitus without complication ([place]) 22/63/3354  . History of gastric bypass 02/07/2017  . OSA (obstructive sleep apnea) 02/07/2017  . Obesity (BMI 30-39.9) 02/07/2017  . Hypogonadism in male 02/07/2017  RAMSEUR,CHRIS, PTA 09/22/2017, 12:48 PM  [facility] [address] [place], [name], [address] Phone:  [phone]   Fax:  [phone]  Name: Bon Dowis MRN: [id number] Date of Birth: April 07, 1951

## 2017-09-26 ENCOUNTER — Ambulatory Visit: Payer: Medicare HMO | Admitting: *Deleted

## 2017-09-26 DIAGNOSIS — M6281 Muscle weakness (generalized): Secondary | ICD-10-CM

## 2017-09-26 DIAGNOSIS — M5442 Lumbago with sciatica, left side: Secondary | ICD-10-CM

## 2017-09-26 DIAGNOSIS — M21372 Foot drop, left foot: Secondary | ICD-10-CM

## 2017-09-26 NOTE — Therapy (Signed)
[facility] [place], [name], [address] Phone: [phone]   Fax:  [phone]  Physical Therapy Treatment  Patient Details  Name: Aaron Shah MRN: [id number] Date of Birth: 31-Dec-1950 Referring Provider: Consuella Lose MD.   Encounter Date: 09/26/2017  PT End of Session - 09/26/17 1526    Visit Number  5    Number of Visits  12    Date for PT Re-Evaluation  10/25/17    PT Start Time  1430    PT Stop Time  1521    PT Time Calculation (min)  51 min       Past Medical History:  Diagnosis Date  . Arthritis    entire spine, C4,5,6- HNP, seeing MD at [facility]   . Asthma    h/o- uses Proventil when he has a flare   . Cataract   . Concussion 05/2013   resulted from a fall, had been rx for depression after that but developed feelings of homicide & suicide, med. was d/c'd  . Diabetes mellitus without complication ([place]) 6834   oral med.   . Diabetes mellitus without complication ([place])   . H/O exercise stress test 2016   pt,. reports that it was done thru the [place] in [place]  . Hypertension   . Pneumonia 1970's   hosp. in [employer], pt. reports that he is prone to getting bronchitis   . Sleep apnea 2016   CPAP machine but not using at present, test done in [place] , will have a new study here in [place]     Past Surgical History:  Procedure Laterality Date  . CATARACT EXTRACTION W/PHACO Left 08/19/2015   Procedure: CATARACT EXTRACTION PHACO AND INTRAOCULAR LENS PLACEMENT (IOC) LEFT EYE;  Surgeon: Marylynn Pearson, MD;  Location: [place];  Service: Ophthalmology;  Laterality: Left;  . EYE SURGERY     glass removed fr. R eye   . GASTRIC BYPASS  12/2004   [place], [place], weight was 400lbs. prior to   . HERNIA REPAIR     inguinal- L   . IRRIGATION AND DEBRIDEMENT SEBACEOUS CYST Right    arm- upper, cysts both removed  . LEFT HEART CATH AND CORONARY ANGIOGRAPHY N/A 02/15/2017   Procedure: LEFT HEART  CATH AND CORONARY ANGIOGRAPHY;  Surgeon: Leonie Man, MD;  Location: [place] [facility];  Service: Cardiovascular;  Laterality: N/A;  . TESTICLE REMOVAL  1960's, 2001   both removed   . TONSILLECTOMY      There were no vitals filed for this visit.  Subjective Assessment - 09/26/17 1449    Subjective  I fell at home yesterday. I was walking barefoot at home and my LT foot dragged and I fell over my walker  (Pended)     Pertinent History  Concussion in past.  2 recent falls.  (Pended)     Limitations  Walking  (Pended)     How long can you walk comfortably?  Short distances with a FWW.  (Pended)     Diagnostic tests  X-ray and MRI.  (Pended)     Patient Stated Goals  See above.  (Pended)     Currently in Pain?  Yes  (Pended)     Pain Descriptors / Indicators  Aching  (Pended)     Pain Type  Acute pain  (Pended)     Pain Frequency  Intermittent  (Pended)  [facility] Adult PT Treatment/Exercise - 09/26/17 0001      Ambulation/Gait   Gait Comments  Steppage gait due to left foot drop with patient using a FWW.      Exercises   Exercises  Knee/Hip;Ankle      Knee/Hip Exercises: Aerobic   Nustep  Level 4 to 16 minutes moving forward x 1 to increase dorsiflexion.      Modalities   Modalities  Electrical Stimulation;Moist Heat      Moist Heat Therapy   Number Minutes Moist Heat  15 Minutes    Moist Heat Location  Lumbar Spine      Electrical Stimulation   Electrical Stimulation Location  Bilateral low back IPremod x 15 mins 80-150hz , VMS x 15 mins to RT AT 10 secs on/off at the same time in sitting    Electrical Stimulation Goals  Tone;Pain      Ankle Exercises: Seated   Other Seated Ankle Exercises  Seated  seated  dyna discx 5 minutes for neuro re-education.    Other Seated Ankle Exercises  Rockerboard x 15 mins with VMS for DF      Ankle Exercises: Standing   Rocker Board  -- 4 mins PF /DF, 4 mins balance                   PT Long Term Goals - 09/13/17 1525      PT LONG TERM GOAL #1   Title  Independent with a HEP.    Time  6    Period  Weeks    Status  New      PT LONG TERM GOAL #2   Title  Restore full left ankle AROM.    Time  6    Period  Weeks    Status  New      PT LONG TERM GOAL #3   Title  Left ankle strength to 4+/5.    Time  6    Period  Weeks    Status  New      PT LONG TERM GOAL #4   Title  Walk without an assistive device and normal gait pattern.    Time  6    Period  Weeks    Status  New      PT LONG TERM GOAL #5   Title  Perform ADL's with pain not > 2-3/10.    Time  6    Period  Weeks    Status  New            Plan - 09/26/17 1554    Clinical Impression Statement  Pt arrived today reporting that he tripped at home while walking barefoot inside his house. He reports being sore, but nothing serious. Rx focused on LT foot neuro re-ed to facilitate DF, EV, and Inv. VMS was used  on AT  while  performing DF on rocker board in sitting.    Clinical Presentation  Unstable    Clinical Decision Making  Moderate    Rehab Potential  Fair    Clinical Impairments Affecting Rehab Potential  Uncertain rehab potential due to nerve damage from work injury.    PT Frequency  2x / week    PT Duration  6 weeks    PT Treatment/Interventions  ADLs/Self Care Home Management;Electrical Stimulation;Ultrasound;Functional mobility training;Therapeutic activities;Therapeutic exercise;Balance training;Neuromuscular re-education;Patient/family education;Passive range of motion;Manual techniques    PT Next Visit Plan  Please follow laminecetomy protocol; try e'stim to low back and left lower  leg to restore left ankle function.  Seated BAPS.    Consulted and Agree with Plan of Care  Patient       Patient will benefit from skilled therapeutic intervention in order to improve the following deficits and impairments:  Abnormal gait, Decreased activity tolerance, Decreased  strength, Pain  Visit Diagnosis: Acute bilateral low back pain with left-sided sciatica  Foot drop, left  Muscle weakness (generalized)     Problem List Patient Active Problem List   Diagnosis Date Noted  . Dyspnea on exertion 02/09/2017  . Diabetes mellitus without complication ([place]) 94/17/4081  . History of gastric bypass 02/07/2017  . OSA (obstructive sleep apnea) 02/07/2017  . Obesity (BMI 30-39.9) 02/07/2017  . Hypogonadism in male 02/07/2017    Arav Bannister,CHRIS, PTA 09/26/2017, 4:02 PM  [facility] [address] [place], [name], [address] Phone: [phone]   Fax:  [phone]  Name: Aaron Shah MRN: [id number] Date of Birth: April 08, 1951

## 2017-09-28 ENCOUNTER — Ambulatory Visit: Payer: Medicare HMO | Admitting: *Deleted

## 2017-09-28 DIAGNOSIS — M5442 Lumbago with sciatica, left side: Secondary | ICD-10-CM

## 2017-09-28 DIAGNOSIS — M21372 Foot drop, left foot: Secondary | ICD-10-CM

## 2017-09-28 DIAGNOSIS — M6281 Muscle weakness (generalized): Secondary | ICD-10-CM

## 2017-09-28 NOTE — Therapy (Signed)
[facility] [place], [name], [address] Phone: [phone]   Fax:  [phone]  Physical Therapy Treatment  Patient Details  Name: Aaron Shah MRN: [id number] Date of Birth: [birth year] Referring Provider: Consuella Lose MD.   Encounter Date: 09/28/2017  PT End of Session - 09/28/17 1437    Visit Number  6    Number of Visits  12    Date for PT Re-Evaluation  10/25/17    PT Start Time  1430    PT Stop Time  1520    PT Time Calculation (min)  50 min       Past Medical History:  Diagnosis Date  . Arthritis    entire spine, C4,5,6- HNP, seeing MD at [facility]   . Asthma    h/o- uses Proventil when he has a flare   . Cataract   . Concussion 05/2013   resulted from a fall, had been rx for depression after that but developed feelings of homicide & suicide, med. was d/c'd  . Diabetes mellitus without complication ([place]) 6761   oral med.   . Diabetes mellitus without complication ([place])   . H/O exercise stress test 2016   pt,. reports that it was done thru the [place] in [place]  . Hypertension   . Pneumonia 1970's   hosp. [place] [employer], pt. reports that he is prone to getting bronchitis   . Sleep apnea 2016   CPAP machine but not using at present, test done in [place] , will have a new study here in [place]     Past Surgical History:  Procedure Laterality Date  . CATARACT EXTRACTION W/PHACO Left 08/19/2015   Procedure: CATARACT EXTRACTION PHACO AND INTRAOCULAR LENS PLACEMENT (IOC) LEFT EYE;  Surgeon: Marylynn Pearson, MD;  Location: [facility];  Service: Ophthalmology;  Laterality: Left;  . EYE SURGERY     glass removed fr. R eye   . GASTRIC BYPASS  12/2004   [place], [place], weight was 400lbs. prior to   . HERNIA REPAIR     inguinal- L   . IRRIGATION AND DEBRIDEMENT SEBACEOUS CYST Right    arm- upper, cysts both removed  . LEFT HEART CATH AND CORONARY ANGIOGRAPHY N/A 02/15/2017   Procedure: LEFT HEART  CATH AND CORONARY ANGIOGRAPHY;  Surgeon: Leonie Man, MD;  Location: [facility];  Service: Cardiovascular;  Laterality: N/A;  . TESTICLE REMOVAL  1960's, 2001   both removed   . TONSILLECTOMY      There were no vitals filed for this visit.  Subjective Assessment - 09/28/17 1437    Subjective  [facility] called  and I have an appt next Wed for AFO    Pertinent History  Concussion in past.  2 recent falls.    Limitations  Walking    How long can you walk comfortably?  Short distances with a FWW.    Diagnostic tests  X-ray and MRI.    Patient Stated Goals  See above.    Currently in Pain?  Yes    Pain Score  7                        [facility] Adult PT Treatment/Exercise - 09/28/17 0001      Ambulation/Gait   Gait Comments  Steppage gait due to left foot drop with patient using a FWW.      Exercises   Exercises  Knee/Hip;Ankle  Knee/Hip Exercises: Aerobic   Nustep  Level 5 to 16 minutes moving forward x 1 to increase dorsiflexion.      Modalities   Modalities  Electrical Stimulation;Moist Heat      Moist Heat Therapy   Number Minutes Moist Heat  15 Minutes    Moist Heat Location  Lumbar Spine      Electrical Stimulation   Electrical Stimulation Location  Bilateral low back IPremod x 15 mins 80-150hz , VMS x 15 mins to RT AT 10 secs on/off at the same time in sitting    Electrical Stimulation Goals  Tone;Pain      Ankle Exercises: Standing   Rocker Board  -- 4 mins PF /DF, 4 mins balance      Ankle Exercises: Seated   Other Seated Ankle Exercises  Seated  seated  dyna discx 5 minutes for neuro re-education.    Other Seated Ankle Exercises  Rockerboard x 15 mins with VMS for DF                  PT Long Term Goals - 09/13/17 1525      PT LONG TERM GOAL #1   Title  Independent with a HEP.    Time  6    Period  Weeks    Status  New      PT LONG TERM GOAL #2   Title  Restore full left ankle AROM.    Time  6    Period  Weeks     Status  New      PT LONG TERM GOAL #3   Title  Left ankle strength to 4+/5.    Time  6    Period  Weeks    Status  New      PT LONG TERM GOAL #4   Title  Walk without an assistive device and normal gait pattern.    Time  6    Period  Weeks    Status  New      PT LONG TERM GOAL #5   Title  Perform ADL's with pain not > 2-3/10.    Time  6    Period  Weeks    Status  New            Plan - 09/28/17 1658    Clinical Impression Statement  Pt arrived today doing fair with minimal LBP, but reports no improvement in LT foot control. Pt still ambulates with FWW and steppage gait pattern due to LT foot drop. He reports that  he has an appt for AFO next Wed.. Rx focused  on therex and Nuero re-ed  for LT foot DF, Ev, and Inv.     Clinical Presentation  Unstable    Rehab Potential  Fair    Clinical Impairments Affecting Rehab Potential  Uncertain rehab potential due to nerve damage from work injury.    PT Frequency  2x / week    PT Duration  6 weeks    PT Treatment/Interventions  ADLs/Self Care Home Management;Electrical Stimulation;Ultrasound;Functional mobility training;Therapeutic activities;Therapeutic exercise;Balance training;Neuromuscular re-education;Patient/family education;Passive range of motion;Manual techniques    PT Next Visit Plan  Please follow laminecetomy protocol; try e'stim to low back and left lower leg to restore left ankle function.  Seated BAPS.    Consulted and Agree with Plan of Care  Patient       Patient will benefit from skilled therapeutic intervention in order to improve the following deficits and impairments:  Abnormal gait,  Decreased activity tolerance, Decreased strength, Pain  Visit Diagnosis: Foot drop, left  Muscle weakness (generalized)  Acute bilateral low back pain with left-sided sciatica     Problem List Patient Active Problem List   Diagnosis Date Noted  . Dyspnea on exertion 02/09/2017  . Diabetes mellitus without complication  ([place]) 53/29/9242  . History of gastric bypass 02/07/2017  . OSA (obstructive sleep apnea) 02/07/2017  . Obesity (BMI 30-39.9) 02/07/2017  . Hypogonadism in male 02/07/2017    Aaron Shah,Aaron Shah, PTA 09/28/2017, 5:03 PM  [facility] [address] [place], [name], [address] Phone: [phone]   Fax:  [phone]  Name: Aaron Shah MRN: [id number] Date of Birth: June 23, 1950

## 2017-10-04 ENCOUNTER — Ambulatory Visit: Payer: Medicare HMO | Admitting: Physical Therapy

## 2017-10-04 ENCOUNTER — Encounter: Payer: Self-pay | Admitting: Physical Therapy

## 2017-10-04 DIAGNOSIS — M21372 Foot drop, left foot: Secondary | ICD-10-CM | POA: Diagnosis not present

## 2017-10-04 DIAGNOSIS — M5442 Lumbago with sciatica, left side: Secondary | ICD-10-CM | POA: Diagnosis not present

## 2017-10-04 DIAGNOSIS — M6281 Muscle weakness (generalized): Secondary | ICD-10-CM | POA: Diagnosis not present

## 2017-10-04 NOTE — Therapy (Signed)
[facility] [place], [name], [address] Phone: [phone]   Fax:  [phone]  Physical Therapy Treatment  Patient Details  Name: Aaron Shah MRN: [id number] Date of Birth: Mar 07, 1951 Referring Provider: Consuella Lose MD.   Encounter Date: 10/04/2017  PT End of Session - 10/04/17 1540    Visit Number  7    Number of Visits  12    Date for PT Re-Evaluation  10/25/17    PT Start Time  1430    PT Stop Time  1513    PT Time Calculation (min)  43 min    Activity Tolerance  Patient tolerated treatment well    Behavior During Therapy  [facility] for tasks assessed/performed       Past Medical History:  Diagnosis Date  . Arthritis    entire spine, C4,5,6- HNP, seeing MD at [facility]   . Asthma    h/o- uses Proventil when he has a flare   . Cataract   . Concussion 05/2013   resulted from a fall, had been rx for depression after that but developed feelings of homicide & suicide, med. was d/c'd  . Diabetes mellitus without complication ([place]) 6962   oral med.   . Diabetes mellitus without complication ([place])   . H/O exercise stress test 2016   pt,. reports that it was done thru the [place] in [place]  . Hypertension   . Pneumonia 1970's   hosp. [place] [employer], pt. reports that he is prone to getting bronchitis   . Sleep apnea 2016   CPAP machine but not using at present, test done in [place] , will have a new study here in [place]     Past Surgical History:  Procedure Laterality Date  . CATARACT EXTRACTION W/PHACO Left 08/19/2015   Procedure: CATARACT EXTRACTION PHACO AND INTRAOCULAR LENS PLACEMENT (IOC) LEFT EYE;  Surgeon: Marylynn Pearson, MD;  Location: [facility];  Service: Ophthalmology;  Laterality: Left;  . EYE SURGERY     glass removed fr. R eye   . GASTRIC BYPASS  12/2004   [place], [place], weight was 400lbs. prior to   . HERNIA REPAIR     inguinal- L   . IRRIGATION AND DEBRIDEMENT SEBACEOUS CYST Right     arm- upper, cysts both removed  . LEFT HEART CATH AND CORONARY ANGIOGRAPHY N/A 02/15/2017   Procedure: LEFT HEART CATH AND CORONARY ANGIOGRAPHY;  Surgeon: Leonie Man, MD;  Location: [facility];  Service: Cardiovascular;  Laterality: N/A;  . TESTICLE REMOVAL  1960's, 2001   both removed   . TONSILLECTOMY      There were no vitals filed for this visit.  Subjective Assessment - 10/04/17 1434    Subjective  Reports he went and got fit at [facility] for AFO and will be 4 weeks until its ready. Reports that being tired today.    Pertinent History  Concussion in past.  2 recent falls.    Limitations  Walking    How long can you walk comfortably?  Short distances with a FWW.    Diagnostic tests  X-ray and MRI.    Patient Stated Goals  See above.    Currently in Pain?  Other (Comment) No pain assessment provided by patient         [facility] PT Assessment - 10/04/17 0001      Assessment   Medical Diagnosis  Left foot drop.  [facility] Adult PT Treatment/Exercise - 10/04/17 0001      Knee/Hip Exercises: Aerobic   Nustep  Level 5 to 16 minutes moving forward x 1 to increase dorsiflexion.      Modalities   Modalities  Electrical Stimulation;Moist Heat      Moist Heat Therapy   Number Minutes Moist Heat  15 Minutes    Moist Heat Location  Lumbar Spine      Electrical Stimulation   Electrical Stimulation Location  R low back; L ant. tibialis    Electrical Stimulation Action  Pre-Mod; VMS    Electrical Stimulation Parameters  80-150 hz x15 min; 10/10, 50 pps, 5 sec ramp, 300 usec x15 min    Electrical Stimulation Goals  Pain;Neuromuscular facilitation      Ankle Exercises: Seated   Other Seated Ankle Exercises  Rockerboard with VMS for NMR of L ant. tibialias                  PT Long Term Goals - 09/13/17 1525      PT LONG TERM GOAL #1   Title  Independent with a HEP.    Time  6    Period  Weeks    Status  New      PT LONG TERM  GOAL #2   Title  Restore full left ankle AROM.    Time  6    Period  Weeks    Status  New      PT LONG TERM GOAL #3   Title  Left ankle strength to 4+/5.    Time  6    Period  Weeks    Status  New      PT LONG TERM GOAL #4   Title  Walk without an assistive device and normal gait pattern.    Time  6    Period  Weeks    Status  New      PT LONG TERM GOAL #5   Title  Perform ADL's with pain not > 2-3/10.    Time  6    Period  Weeks    Status  New            Plan - 10/04/17 1551    Clinical Impression Statement  Patient tolerated today's treatment well with continued lack of L ankle DF. Patient still presents ambulating in steppage gait with FWW. Patient somewhat more encouraged for AFO. Patient still unable to detect any activation of L anterior tibialis with VMS and rockerboard. Normal modalities response noted following removal of the modalities.    Rehab Potential  Fair    Clinical Impairments Affecting Rehab Potential  Uncertain rehab potential due to nerve damage from work injury.    PT Frequency  2x / week    PT Duration  6 weeks    PT Treatment/Interventions  ADLs/Self Care Home Management;Electrical Stimulation;Ultrasound;Functional mobility training;Therapeutic activities;Therapeutic exercise;Balance training;Neuromuscular re-education;Patient/family education;Passive range of motion;Manual techniques    PT Next Visit Plan  Please follow laminecetomy protocol; try e'stim to low back and left lower leg to restore left ankle function.  Seated BAPS.    Consulted and Agree with Plan of Care  Patient       Patient will benefit from skilled therapeutic intervention in order to improve the following deficits and impairments:  Abnormal gait, Decreased activity tolerance, Decreased strength, Pain  Visit Diagnosis: Foot drop, left  Muscle weakness (generalized)  Acute bilateral low back pain with left-sided sciatica  Problem List Patient Active Problem List    Diagnosis Date Noted  . Dyspnea on exertion 02/09/2017  . Diabetes mellitus without complication ([place]) 27/63/9432  . History of gastric bypass 02/07/2017  . OSA (obstructive sleep apnea) 02/07/2017  . Obesity (BMI 30-39.9) 02/07/2017  . Hypogonadism in male 02/07/2017    Standley Brooking, PTA 10/04/2017, 3:58 PM  [facility] [place], [name], [address] Phone: [phone]   Fax:  [phone]  Name: Jibri Schriefer MRN: [id number] Date of Birth: [birth year]

## 2017-10-06 ENCOUNTER — Ambulatory Visit: Payer: Medicare HMO | Admitting: Physical Therapy

## 2017-10-06 DIAGNOSIS — M6281 Muscle weakness (generalized): Secondary | ICD-10-CM

## 2017-10-06 DIAGNOSIS — M5442 Lumbago with sciatica, left side: Secondary | ICD-10-CM

## 2017-10-06 DIAGNOSIS — M21372 Foot drop, left foot: Secondary | ICD-10-CM | POA: Diagnosis not present

## 2017-10-06 NOTE — Therapy (Signed)
[facility] [place], [name], [address] Phone: [phone]   Fax:  [phone]  Physical Therapy Treatment  Patient Details  Name: Aaron Shah MRN: [id number] Date of Birth: 18-Apr-1951 Referring Provider: Consuella Lose MD.   Encounter Date: 10/06/2017  PT End of Session - 10/06/17 0945    Visit Number  8    Number of Visits  12    Date for PT Re-Evaluation  10/25/17    PT Start Time  0946    PT Stop Time  1048    PT Time Calculation (min)  62 min    Activity Tolerance  Patient tolerated treatment well    Behavior During Therapy  [facility] for tasks assessed/performed       Past Medical History:  Diagnosis Date  . Arthritis    entire spine, C4,5,6- HNP, seeing MD at [facility]   . Asthma    h/o- uses Proventil when he has a flare   . Cataract   . Concussion 05/2013   resulted from a fall, had been rx for depression after that but developed feelings of homicide & suicide, med. was d/c'd  . Diabetes mellitus without complication ([place]) 8756   oral med.   . Diabetes mellitus without complication ([place])   . H/O exercise stress test 2016   pt,. reports that it was done thru the [place] in [place]  . Hypertension   . Pneumonia 1970's   hosp. [place], pt. reports that he is prone to getting bronchitis   . Sleep apnea 2016   CPAP machine but not using at present, test done in [place] , will have a new study here in [place]     Past Surgical History:  Procedure Laterality Date  . CATARACT EXTRACTION W/PHACO Left 08/19/2015   Procedure: CATARACT EXTRACTION PHACO AND INTRAOCULAR LENS PLACEMENT (IOC) LEFT EYE;  Surgeon: Marylynn Pearson, MD;  Location: [facility];  Service: Ophthalmology;  Laterality: Left;  . EYE SURGERY     glass removed fr. R eye   . GASTRIC BYPASS  12/2004   [place], [place], weight was 400lbs. prior to   . HERNIA REPAIR     inguinal- L   . IRRIGATION AND DEBRIDEMENT SEBACEOUS CYST Right     arm- upper, cysts both removed  . LEFT HEART CATH AND CORONARY ANGIOGRAPHY N/A 02/15/2017   Procedure: LEFT HEART CATH AND CORONARY ANGIOGRAPHY;  Surgeon: Leonie Man, MD;  Location: [facility];  Service: Cardiovascular;  Laterality: N/A;  . TESTICLE REMOVAL  1960's, 2001   both removed   . TONSILLECTOMY      There were no vitals filed for this visit.  Subjective Assessment - 10/06/17 0950    Subjective  reports pain in low back today. He also reports cramping in left calf overnight.    Patient Stated Goals  See above.    Currently in Pain?  Yes    Pain Score  4     Pain Location  Back    Pain Orientation  Right    Pain Descriptors / Indicators  Aching                       [facility] Adult PT Treatment/Exercise - 10/06/17 0001      Knee/Hip Exercises: Stretches   Gastroc Stretch  Left;2 reps;60 seconds      Knee/Hip Exercises: Aerobic   Nustep  Level 5 to 16 minutes moving forward x  1 to increase dorsiflexion.      Knee/Hip Exercises: Standing   Knee Flexion  Strengthening;1 set;5 reps;Both    Hip Abduction  Stengthening;Both;20 reps      Modalities   Modalities  Electrical Stimulation      Moist Heat Therapy   Number Minutes Moist Heat  15 Minutes    Moist Heat Location  Lumbar Spine      Electrical Stimulation   Electrical Stimulation Location  R low back;     Electrical Stimulation Action  premod    Electrical Stimulation Parameters  80-150 Hz x 15 min;     Electrical Stimulation Goals  Pain      Manual Therapy   Manual Therapy  Taping    [place]  to L ant tibialis                  PT Long Term Goals - 09/13/17 1525      PT LONG TERM GOAL #1   Title  Independent with a HEP.    Time  6    Period  Weeks    Status  New      PT LONG TERM GOAL #2   Title  Restore full left ankle AROM.    Time  6    Period  Weeks    Status  New      PT LONG TERM GOAL #3   Title  Left ankle strength to 4+/5.    Time  6    Period   Weeks    Status  New      PT LONG TERM GOAL #4   Title  Walk without an assistive device and normal gait pattern.    Time  6    Period  Weeks    Status  New      PT LONG TERM GOAL #5   Title  Perform ADL's with pain not > 2-3/10.    Time  6    Period  Weeks    Status  New            Plan - 10/06/17 1234    Clinical Impression Statement  Patient will benefit from standing hip strengthening. He has marked tightness in L gastroc and was given standing stretch. Leukotape applied, but no significant change due tightness in gastroc.    PT Treatment/Interventions  ADLs/Self Care Home Management;Electrical Stimulation;Ultrasound;Functional mobility training;Therapeutic activities;Therapeutic exercise;Balance training;Neuromuscular re-education;Patient/family education;Passive range of motion;Manual techniques    PT Next Visit Plan  Please follow laminecetomy protocol; try e'stim to low back and left lower leg to restore left ankle function.  Seated BAPS.    Consulted and Agree with Plan of Care  Patient       Patient will benefit from skilled therapeutic intervention in order to improve the following deficits and impairments:  Abnormal gait, Decreased activity tolerance, Decreased strength, Pain  Visit Diagnosis: Foot drop, left  Muscle weakness (generalized)  Acute bilateral low back pain with left-sided sciatica     Problem List Patient Active Problem List   Diagnosis Date Noted  . Dyspnea on exertion 02/09/2017  . Diabetes mellitus without complication ([place]) 35/00/9381  . History of gastric bypass 02/07/2017  . OSA (obstructive sleep apnea) 02/07/2017  . Obesity (BMI 30-39.9) 02/07/2017  . Hypogonadism in male 02/07/2017    Jayan Raymundo PT 10/06/2017, 12:41 PM  [facility] [address] [place], [name], [address] Phone: [phone]   Fax:  [phone]  Name: Nachman  Dilks MRN: [id number] Date of Birth:  1950-12-30

## 2017-10-06 NOTE — Patient Instructions (Signed)
Gastroc Stretch    Stand with right foot back, leg straight, forward leg bent. Keeping heel on floor, turned slightly out, lean into wall until stretch is felt in calf. Hold __60__ seconds. Repeat _3_ times per set. Do ____ sets per session. Do __3__ sessions per day.  http://orth.exer.us/26   Copyright  VHI. All rights reserved.   Knee High   Holding stable object, raise knee to hip level, then lower knee. Repeat with other knee. Complete __10_ repetitions. Do 1-3 sets of 10. Do __2__ sessions per day.  ABDUCTION: Standing (Active)   Stand, feet flat. Lift right leg out to side. Use _0__ lbs. Complete __10_ repetitions. Do 1-3 sets of 10.  Perform __2_ sessions per day.   Madelyn Flavors, PT 10/06/17 10:34 AM; [facility] [place], [name], [address] Phone: [phone]   Fax:  [phone]

## 2017-10-11 ENCOUNTER — Ambulatory Visit (INDEPENDENT_AMBULATORY_CARE_PROVIDER_SITE_OTHER): Payer: Medicare HMO | Admitting: Pediatrics

## 2017-10-11 ENCOUNTER — Encounter: Payer: Self-pay | Admitting: Pediatrics

## 2017-10-11 ENCOUNTER — Ambulatory Visit: Payer: Medicare HMO | Attending: Neurosurgery | Admitting: Physical Therapy

## 2017-10-11 VITALS — BP 123/75 | HR 54 | Temp 98.0°F | Ht 71.0 in | Wt 265.8 lb

## 2017-10-11 DIAGNOSIS — E785 Hyperlipidemia, unspecified: Secondary | ICD-10-CM | POA: Diagnosis not present

## 2017-10-11 DIAGNOSIS — E1169 Type 2 diabetes mellitus with other specified complication: Secondary | ICD-10-CM

## 2017-10-11 DIAGNOSIS — Z9884 Bariatric surgery status: Secondary | ICD-10-CM

## 2017-10-11 DIAGNOSIS — M21372 Foot drop, left foot: Secondary | ICD-10-CM | POA: Diagnosis not present

## 2017-10-11 DIAGNOSIS — M6281 Muscle weakness (generalized): Secondary | ICD-10-CM | POA: Insufficient documentation

## 2017-10-11 DIAGNOSIS — Z5181 Encounter for therapeutic drug level monitoring: Secondary | ICD-10-CM

## 2017-10-11 DIAGNOSIS — E119 Type 2 diabetes mellitus without complications: Secondary | ICD-10-CM | POA: Diagnosis not present

## 2017-10-11 DIAGNOSIS — R142 Eructation: Secondary | ICD-10-CM

## 2017-10-11 DIAGNOSIS — I10 Essential (primary) hypertension: Secondary | ICD-10-CM | POA: Diagnosis not present

## 2017-10-11 DIAGNOSIS — R1013 Epigastric pain: Secondary | ICD-10-CM

## 2017-10-11 DIAGNOSIS — M545 Low back pain, unspecified: Secondary | ICD-10-CM

## 2017-10-11 DIAGNOSIS — M5442 Lumbago with sciatica, left side: Secondary | ICD-10-CM

## 2017-10-11 LAB — BAYER DCA HB A1C WAIVED: HB A1C (BAYER DCA - WAIVED): 6.7 % (ref <7.0)

## 2017-10-11 MED ORDER — CYCLOBENZAPRINE HCL 10 MG PO TABS: 10.0000 mg | ORAL_TABLET | Freq: Two times a day (BID) | ORAL | 1 refills | Status: DC | PRN

## 2017-10-11 MED ORDER — PANTOPRAZOLE SODIUM 40 MG PO TBEC
40.0000 mg | DELAYED_RELEASE_TABLET | Freq: Every day | ORAL | 3 refills | Status: DC
Start: 2017-10-11 — End: 2017-11-16

## 2017-10-11 NOTE — Therapy (Signed)
[facility] [place], [name], [address] Phone: [phone]   Fax:  [phone]  Physical Therapy Treatment  Patient Details  Name: Aaron Shah MRN: [id number] Date of Birth: Apr 15, 1951 Referring Provider: Consuella Lose MD.   Encounter Date: 10/11/2017  PT End of Session - 10/11/17 1107    Visit Number  9    Number of Visits  12    Date for PT Re-Evaluation  10/25/17    PT Start Time  1030    PT Stop Time  1129    PT Time Calculation (min)  59 min    Activity Tolerance  Patient tolerated treatment well    Behavior During Therapy  [facility] for tasks assessed/performed       Past Medical History:  Diagnosis Date  . Arthritis    entire spine, C4,5,6- HNP, seeing MD at [facility]   . Asthma    h/o- uses Proventil when he has a flare   . Cataract   . Concussion 05/2013   resulted from a fall, had been rx for depression after that but developed feelings of homicide & suicide, med. was d/c'd  . Diabetes mellitus without complication ([place]) 8315   oral med.   . Diabetes mellitus without complication ([place])   . H/O exercise stress test 2016   pt,. reports that it was done thru the [place] in [place]  . Hypertension   . Pneumonia 1970's   hosp. in [employer], pt. reports that he is prone to getting bronchitis   . Sleep apnea 2016   CPAP machine but not using at present, test done in [place] , will have a new study here in [place]     Past Surgical History:  Procedure Laterality Date  . CATARACT EXTRACTION W/PHACO Left 08/19/2015   Procedure: CATARACT EXTRACTION PHACO AND INTRAOCULAR LENS PLACEMENT (IOC) LEFT EYE;  Surgeon: Marylynn Pearson, MD;  Location: [place];  Service: Ophthalmology;  Laterality: Left;  . EYE SURGERY     glass removed fr. R eye   . GASTRIC BYPASS  12/2004   [place], [place], weight was 400lbs. prior to   . HERNIA REPAIR     inguinal- L   . IRRIGATION AND DEBRIDEMENT SEBACEOUS CYST Right     arm- upper, cysts both removed  . LEFT HEART CATH AND CORONARY ANGIOGRAPHY N/A 02/15/2017   Procedure: LEFT HEART CATH AND CORONARY ANGIOGRAPHY;  Surgeon: Leonie Man, MD;  Location: [place] [facility];  Service: Cardiovascular;  Laterality: N/A;  . TESTICLE REMOVAL  1960's, 2001   both removed   . TONSILLECTOMY      There were no vitals filed for this visit.  Subjective Assessment - 10/11/17 1107    Subjective  "My back is sore today."    Pertinent History  Concussion in past.  2 recent falls.    Limitations  Walking         [facility] PT Assessment - 10/11/17 0001      Assessment   Medical Diagnosis  Left foot drop.                   [place] Adult PT Treatment/Exercise - 10/11/17 0001      Knee/Hip Exercises: Stretches   Other Knee/Hip Stretches  Pro stretch 30" seated, 1 min, 30 seconds standing with walker for support      Knee/Hip Exercises: Aerobic   Nustep  Level 5 to 16 minutes moving forward x 1 to increase  dorsiflexion.      Knee/Hip Exercises: Standing   Knee Flexion  Strengthening;Both;2 sets;10 reps    Hip Abduction  Stengthening;Both;20 reps      Knee/Hip Exercises: Seated   Clamshell with TheraBand  Red 2x15      Modalities   Modalities  Electrical Stimulation      Moist Heat Therapy   Number Minutes Moist Heat  15 Minutes    Moist Heat Location  Lumbar Spine      Electrical Stimulation   Electrical Stimulation Location  R low back;     Electrical Stimulation Action  premod    Electrical Stimulation Parameters  80-150 hz x15    Electrical Stimulation Goals  Pain      Ankle Exercises: Seated   BAPS  Sitting;Level 1 20x clockwise and counterclockwise                  PT Long Term Goals - 09/13/17 1525      PT LONG TERM GOAL #1   Title  Independent with a HEP.    Time  6    Period  Weeks    Status  New      PT LONG TERM GOAL #2   Title  Restore full left ankle AROM.    Time  6    Period  Weeks    Status  New       PT LONG TERM GOAL #3   Title  Left ankle strength to 4+/5.    Time  6    Period  Weeks    Status  New      PT LONG TERM GOAL #4   Title  Walk without an assistive device and normal gait pattern.    Time  6    Period  Weeks    Status  New      PT LONG TERM GOAL #5   Title  Perform ADL's with pain not > 2-3/10.    Time  6    Period  Weeks    Status  New            Plan - 10/11/17 1130    Clinical Impression Statement  Patient was able to tolerate treatment with no reports of pain with hip strengthening exercises. Patient demonstrated good form with cuing for upright posture. No adverse affects noted at the removal of modalities.    Clinical Presentation  Unstable    Clinical Decision Making  Moderate    Rehab Potential  Fair    Clinical Impairments Affecting Rehab Potential  Uncertain rehab potential due to nerve damage from work injury.    PT Frequency  2x / week    PT Duration  6 weeks    PT Treatment/Interventions  ADLs/Self Care Home Management;Electrical Stimulation;Ultrasound;Functional mobility training;Therapeutic activities;Therapeutic exercise;Balance training;Neuromuscular re-education;Patient/family education;Passive range of motion;Manual techniques    PT Next Visit Plan  Cont standing hip strengthening, VMS to ant tib. Please follow laminecetomy protocol; try e'stim to low back and left lower leg to restore left ankle function.  Seated BAPS.    Consulted and Agree with Plan of Care  Patient       Patient will benefit from skilled therapeutic intervention in order to improve the following deficits and impairments:  Abnormal gait, Decreased activity tolerance, Decreased strength, Pain  Visit Diagnosis: Foot drop, left  Muscle weakness (generalized)  Acute bilateral low back pain with left-sided sciatica     Problem List Patient Active Problem List  Diagnosis Date Noted  . Dyspnea on exertion 02/09/2017  . Diabetes mellitus without complication  ([place]) 19/16/6060  . History of gastric bypass 02/07/2017  . OSA (obstructive sleep apnea) 02/07/2017  . Obesity (BMI 30-39.9) 02/07/2017  . Hypogonadism in male 02/07/2017    Gabriela Eves, PT, DPT 10/11/2017, 12:20 PM  [facility] [address] [place], [name], [address] Phone: [phone]   Fax:  [phone]  Name: Aaron Shah MRN: [id number] Date of Birth: 10-18-1950

## 2017-10-11 NOTE — Progress Notes (Signed)
  Subjective:   Patient ID: Aaron Shah, male    DOB: 06/04/1950, 67 y.o.   MRN: [id number] CC: Belching  HPI: Aaron Shah is a 67 y.o. male   Also following with the [place]. Seeing a GI   Injured back at work in 07/2017, had back surgery then for herniated disc. Now with L leg weakness, foot drop. Ordered foot brace, not yet received. Walking with walker due to weakness.  Right side of his back has been bothering more the last week.  No pain down his leg.  No weakness in his right leg.  Having sharp pains in stomach area, started about 3 mo ago. Had gastric bypass 2006. Down from 400 lbs. Trying to avoid fatty foods.   Not drinking EtOH more than once a month, probably less. Taking ibuprofen 463m irreguarly, less than once a week. Was taking higher doses starting about 3 mo ago. Has been belching more than usual past few months. Not taking anything for acid reflux, no burning in esophagus.  Diabetes: Taking metformin regularly.  Hypertension: Taking medicines regularly.  No shortness of breath or chest pain  Relevant past medical, surgical, family and social history reviewed. Allergies and medications reviewed and updated. Social History   Tobacco Use  Smoking Status Never Smoker  Smokeless Tobacco Never Used   ROS: Per HPI   Objective:    BP 123/75   Pulse (!) 54   Temp 98 F (36.7 C) (Oral)   Ht _0  (1.803 m)   Wt 265 lb 12.8 oz (120.6 kg)   BMI 37.07 kg/m   Wt Readings from Last 3 Encounters:  10/11/17 265 lb 12.8 oz (120.6 kg)  08/19/17 255 lb (115.7 kg)  08/14/17 257 lb 3.2 oz (116.7 kg)    Gen: NAD, alert, cooperative with exam, NCAT EYES:  no conjunctival injection, or no icterus ENT: OP without erythema LYMPH: no cervical LAD CV: NRRR, normal S1/S2, no murmur, distal pulses 2+ b/l Resp: CTABL, no wheezes, normal WOB Abd: +BS, soft, ND. no guarding or rebound Ext: Nonpitting edema present left ankle > R. warm Neuro: Alert and oriented, 0/5 strength L ankle  dorsiflexion, 5/5 strength R ankle dorsi/plantar flexion. gait with L foot drop, walks with walker MSK: mildly ttp over right lower back paraspinal muscles   Assessment & Plan:  WNigelwas seen today for belching.  Diagnoses and all orders for this visit:  Diabetes mellitus without complication ([place] AN1A6.7, continue metformin. -     Bayer DCA Hb A1c Waived  Epigastric pain Belching Start below.  Will get labs.  Follow-up in couple months.  May need additional imaging. -     pantoprazole (PROTONIX) 40 MG tablet; Take 1 tablet (40 mg total) by mouth daily.  History of gastric bypass Continue Vitamins  Right-sided low back pain without sciatica, unspecified chronicity Trial below.  Will cause sleepiness.  Do not take and drive. -     cyclobenzaprine (FLEXERIL) 10 MG tablet; Take 1 tablet (10 mg total) by mouth 2 (two) times daily as needed for muscle spasms.  Medication monitoring encounter -     CBC with Differential/Platelet  Essential hypertension Stable, continue current medicines -     CMP14+EGFR  Hyperlipidemia associated with type 2 diabetes mellitus ([place] Not on statin, will get labs and discuss start next visit. -     Lipid panel   Follow up plan: Return in about 5 weeks (around 11/15/2017). CAssunta Found MD [place]

## 2017-10-12 ENCOUNTER — Ambulatory Visit: Payer: Medicare HMO | Admitting: *Deleted

## 2017-10-12 DIAGNOSIS — M6281 Muscle weakness (generalized): Secondary | ICD-10-CM | POA: Diagnosis not present

## 2017-10-12 DIAGNOSIS — M21372 Foot drop, left foot: Secondary | ICD-10-CM | POA: Diagnosis not present

## 2017-10-12 DIAGNOSIS — M5442 Lumbago with sciatica, left side: Secondary | ICD-10-CM

## 2017-10-12 LAB — CMP14+EGFR
ALT: 27 [IU]/L (ref 0–44)
AST: 29 [IU]/L (ref 0–40)
Albumin/Globulin Ratio: 1.7 (ref 1.2–2.2)
Albumin: 4 g/dL (ref 3.6–4.8)
Alkaline Phosphatase: 86 [IU]/L (ref 39–117)
BUN/Creatinine Ratio: 16 (ref 10–24)
BUN: 19 mg/dL (ref 8–27)
Bilirubin Total: 0.4 mg/dL (ref 0.0–1.2)
CO2: 23 mmol/L (ref 20–29)
Calcium: 9.1 mg/dL (ref 8.6–10.2)
Chloride: 106 mmol/L (ref 96–106)
Creatinine, Ser: 1.16 mg/dL (ref 0.76–1.27)
GFR calc Af Amer: 77 mL/min/{1.73_m2}
GFR calc non Af Amer: 66 mL/min/{1.73_m2}
Globulin, Total: 2.4 g/dL (ref 1.5–4.5)
Glucose: 126 mg/dL — ABNORMAL HIGH (ref 65–99)
Potassium: 5 mmol/L (ref 3.5–5.2)
Sodium: 143 mmol/L (ref 134–144)
Total Protein: 6.4 g/dL (ref 6.0–8.5)

## 2017-10-12 LAB — CBC WITH DIFFERENTIAL/PLATELET
Basophils Absolute: 0 10*3/uL (ref 0.0–0.2)
Basos: 0 %
EOS (ABSOLUTE): 0.2 10*3/uL (ref 0.0–0.4)
Eos: 3 %
Hematocrit: 40.5 % (ref 37.5–51.0)
Hemoglobin: 13.1 g/dL (ref 13.0–17.7)
Immature Grans (Abs): 0 10*3/uL (ref 0.0–0.1)
Immature Granulocytes: 0 %
Lymphocytes Absolute: 1.6 10*3/uL (ref 0.7–3.1)
Lymphs: 22 %
MCH: 26.5 pg — ABNORMAL LOW (ref 26.6–33.0)
MCHC: 32.3 g/dL (ref 31.5–35.7)
MCV: 82 fL (ref 79–97)
Monocytes Absolute: 0.6 10*3/uL (ref 0.1–0.9)
Monocytes: 8 %
Neutrophils Absolute: 4.6 10*3/uL (ref 1.4–7.0)
Neutrophils: 67 %
Platelets: 276 10*3/uL (ref 150–450)
RBC: 4.94 x10E6/uL (ref 4.14–5.80)
RDW: 19 % — ABNORMAL HIGH (ref 12.3–15.4)
WBC: 7 10*3/uL (ref 3.4–10.8)

## 2017-10-12 LAB — LIPID PANEL
CHOL/HDL RATIO: 3.2 ratio (ref 0.0–5.0)
CHOLESTEROL TOTAL: 152 mg/dL (ref 100–199)
HDL: 48 mg/dL (ref >39)
LDL Calculated: 88 mg/dL (ref 0–99)
TRIGLYCERIDES: 78 mg/dL (ref 0–149)
VLDL Cholesterol Cal: 16 mg/dL (ref 5–40)

## 2017-10-12 NOTE — Therapy (Addendum)
[facility] [place], [name], [address] Phone: [phone]   Fax:  [phone]  Physical Therapy Treatment  Patient Details  Name: Aaron Shah MRN: [id number] Date of Birth: December 15, 1950 Referring Provider: Consuella Lose MD.   Encounter Date: 10/12/2017  PT End of Session - 10/12/17 1754    Visit Number  10    Number of Visits  12    Date for PT Re-Evaluation  10/25/17    PT Start Time  1031    PT Stop Time  1121    PT Time Calculation (min)  50 min       Past Medical History:  Diagnosis Date  . Arthritis    entire spine, C4,5,6- HNP, seeing MD at [facility]   . Asthma    h/o- uses Proventil when he has a flare   . Cataract   . Concussion 05/2013   resulted from a fall, had been rx for depression after that but developed feelings of homicide & suicide, med. was d/c'd  . Diabetes mellitus without complication ([place]) 5093   oral med.   . Diabetes mellitus without complication ([place])   . H/O exercise stress test 2016   pt,. reports that it was done thru the [place] in [place]  . Hypertension   . Pneumonia 1970's   hosp. [place] [employer], pt. reports that he is prone to getting bronchitis   . Sleep apnea 2016   CPAP machine but not using at present, test done in [place] , will have a new study here in [place]     Past Surgical History:  Procedure Laterality Date  . CATARACT EXTRACTION W/PHACO Left 08/19/2015   Procedure: CATARACT EXTRACTION PHACO AND INTRAOCULAR LENS PLACEMENT (IOC) LEFT EYE;  Surgeon: Marylynn Pearson, MD;  Location: [facility];  Service: Ophthalmology;  Laterality: Left;  . EYE SURGERY     glass removed fr. R eye   . GASTRIC BYPASS  12/2004   [place], [place], weight was 400lbs. prior to   . HERNIA REPAIR     inguinal- L   . IRRIGATION AND DEBRIDEMENT SEBACEOUS CYST Right    arm- upper, cysts both removed  . LEFT HEART CATH AND CORONARY ANGIOGRAPHY N/A 02/15/2017   Procedure: LEFT HEART  CATH AND CORONARY ANGIOGRAPHY;  Surgeon: Leonie Man, MD;  Location: [facility];  Service: Cardiovascular;  Laterality: N/A;  . TESTICLE REMOVAL  1960's, 2001   both removed   . TONSILLECTOMY      There were no vitals filed for this visit.  Subjective Assessment - 10/12/17 1053    Subjective  "My back is sore today."    Pertinent History  Concussion in past.  2 recent falls.    Limitations  Walking    How long can you walk comfortably?  Short distances with a FWW.    Diagnostic tests  X-ray and MRI.    Patient Stated Goals  See above.    Currently in Pain?  Yes    Pain Score  4     Pain Location  Back    Pain Orientation  Right    Pain Descriptors / Indicators  Aching    Pain Type  Acute pain    Pain Frequency  Intermittent                       [facility] Adult PT Treatment/Exercise - 10/12/17 0001      Knee/Hip Exercises: Aerobic  Nustep  Level 5 to 16 minutes moving forward x 1 to increase dorsiflexion.      Knee/Hip Exercises: Standing   Knee Flexion  Strengthening;Both;2 sets;10 reps    Hip Abduction  Stengthening;Both;20 reps      Knee/Hip Exercises: Seated   Clamshell with TheraBand  Red 2x15      Modalities   Modalities  Electrical Stimulation      Moist Heat Therapy   Number Minutes Moist Heat  15 Minutes    Moist Heat Location  Lumbar Spine      Electrical Stimulation   Electrical Stimulation Location  R low back; Premod 80-150hz  x 15 mins    Electrical Stimulation Action  VMS x 15 mins to LT AT  with rocker board     Electrical Stimulation Parameters  sitting    Electrical Stimulation Goals  Pain                  PT Long Term Goals - 10/12/17 1801      PT LONG TERM GOAL #1   Title  Independent with a HEP.    Time  6    Period  Weeks    Status  Achieved      PT LONG TERM GOAL #2   Title  Restore full left ankle AROM.    Time  6    Period  Weeks    Status  On-going      PT LONG TERM GOAL #3   Title  Left  ankle strength to 4+/5.    Time  6    Period  Weeks    Status  On-going      PT LONG TERM GOAL #4   Title  Walk without an assistive device and normal gait pattern.    Time  6    Period  Weeks    Status  On-going      PT LONG TERM GOAL #5   Title  Perform ADL's with pain not > 2-3/10.    Time  6    Period  Weeks    Status  On-going            Plan - 10/12/17 1755    Clinical Impression Statement  Pt arrived today doing fairly well, but reports it would still be a couple weeks befor getting his AFO. He did fairly well with hip and core therex as well as standing balance on rockerboard.. Minimal progression with avtive DF activity LT foot.    Clinical Presentation  Unstable    Clinical Decision Making  Moderate    Clinical Impairments Affecting Rehab Potential  Uncertain rehab potential due to nerve damage from work injury.    PT Frequency  2x / week    PT Duration  6 weeks    PT Treatment/Interventions  ADLs/Self Care Home Management;Electrical Stimulation;Ultrasound;Functional mobility training;Therapeutic activities;Therapeutic exercise;Balance training;Neuromuscular re-education;Patient/family education;Passive range of motion;Manual techniques    PT Next Visit Plan  Cont standing hip strengthening, VMS to ant tib. Please follow laminecetomy protocol; try e'stim to low back and left lower leg to restore left ankle function.  Seated BAPS.    Consulted and Agree with Plan of Care  Patient       Patient will benefit from skilled therapeutic intervention in order to improve the following deficits and impairments:  Abnormal gait, Decreased activity tolerance, Decreased strength, Pain  Visit Diagnosis: Foot drop, left  Muscle weakness (generalized)  Acute bilateral low back pain with left-sided sciatica  Problem List Patient Active Problem List   Diagnosis Date Noted  . Dyspnea on exertion 02/09/2017  . Diabetes mellitus without complication ([place]) 16/38/4665  .  History of gastric bypass 02/07/2017  . OSA (obstructive sleep apnea) 02/07/2017  . Obesity (BMI 30-39.9) 02/07/2017  . Hypogonadism in male 02/07/2017    RAMSEUR,CHRIS, PTA 10/12/2017, 6:02 PM  [facility] [address] [place], [name], [address] Phone: [phone]   Fax:  [phone]  Name: Aaron Shah MRN: [id number] Date of Birth: [birth year]  Progress Note Reporting Period 09/15/17 to 10/12/17  See note below for Objective Data and Assessment of Progress/Goals.  Minimal progress with regards to returning active left ankle dorsiflexion.  Patient to have a custom fit AFO.  Mali Applegate MPT

## 2017-10-17 ENCOUNTER — Ambulatory Visit: Payer: Medicare HMO | Admitting: Physical Therapy

## 2017-10-17 ENCOUNTER — Encounter: Payer: Self-pay | Admitting: Physical Therapy

## 2017-10-17 DIAGNOSIS — M5442 Lumbago with sciatica, left side: Secondary | ICD-10-CM

## 2017-10-17 DIAGNOSIS — M6281 Muscle weakness (generalized): Secondary | ICD-10-CM | POA: Diagnosis not present

## 2017-10-17 DIAGNOSIS — M21372 Foot drop, left foot: Secondary | ICD-10-CM | POA: Diagnosis not present

## 2017-10-17 NOTE — Therapy (Signed)
[facility] [place], [name], [address] Phone: [phone]   Fax:  [phone]  Physical Therapy Treatment  Patient Details  Name: Aaron Shah MRN: [id number] Date of Birth: 1950/10/02 Referring Provider: Consuella Lose MD.   Encounter Date: 10/17/2017  PT End of Session - 10/17/17 1040    Visit Number  11    Number of Visits  12    Date for PT Re-Evaluation  10/25/17    PT Start Time  1032    PT Stop Time  1122    PT Time Calculation (min)  50 min    Activity Tolerance  Patient tolerated treatment well    Behavior During Therapy  [facility] for tasks assessed/performed       Past Medical History:  Diagnosis Date  . Arthritis    entire spine, C4,5,6- HNP, seeing MD at [facility]   . Asthma    h/o- uses Proventil when he has a flare   . Cataract   . Concussion 05/2013   resulted from a fall, had been rx for depression after that but developed feelings of homicide & suicide, med. was d/c'd  . Diabetes mellitus without complication ([place]) 8921   oral med.   . Diabetes mellitus without complication ([place])   . H/O exercise stress test 2016   pt,. reports that it was done thru the [place] in [place]  . Hypertension   . Pneumonia 1970's   hosp. [place] [employer], pt. reports that he is prone to getting bronchitis   . Sleep apnea 2016   CPAP machine but not using at present, test done in [place] , will have a new study here in [place]     Past Surgical History:  Procedure Laterality Date  . CATARACT EXTRACTION W/PHACO Left 08/19/2015   Procedure: CATARACT EXTRACTION PHACO AND INTRAOCULAR LENS PLACEMENT (IOC) LEFT EYE;  Surgeon: Marylynn Pearson, MD;  Location: [facility];  Service: Ophthalmology;  Laterality: Left;  . EYE SURGERY     glass removed fr. R eye   . GASTRIC BYPASS  12/2004   [place], [place], weight was 400lbs. prior to   . HERNIA REPAIR     inguinal- L   . IRRIGATION AND DEBRIDEMENT SEBACEOUS CYST Right     arm- upper, cysts both removed  . LEFT HEART CATH AND CORONARY ANGIOGRAPHY N/A 02/15/2017   Procedure: LEFT HEART CATH AND CORONARY ANGIOGRAPHY;  Surgeon: Leonie Man, MD;  Location: [place] [facility];  Service: Cardiovascular;  Laterality: N/A;  . TESTICLE REMOVAL  1960's, 2001   both removed   . TONSILLECTOMY      There were no vitals filed for this visit.  Subjective Assessment - 10/17/17 1040    Subjective  Got his shoes for his AFOs.    Pertinent History  Concussion in past.  2 recent falls.    Limitations  Walking    How long can you walk comfortably?  Short distances with a FWW.    Diagnostic tests  X-ray and MRI.    Patient Stated Goals  See above.    Currently in Pain?  Other (Comment) No pain assessment provided by patient         [facility] PT Assessment - 10/17/17 0001      Assessment   Medical Diagnosis  Left foot drop.      Precautions   Precautions  Fall      Restrictions   Weight Bearing Restrictions  No  [place] Adult PT Treatment/Exercise - 10/17/17 0001      Knee/Hip Exercises: Aerobic   Nustep  L5 x15 min      Knee/Hip Exercises: Standing   Hip Flexion  AROM;Both;3 sets;10 reps;Knee bent    Hip Abduction  AROM;Both;3 sets;10 reps;Knee straight    Rocker Board  4 minutes      Modalities   Modalities  Electrical Stimulation;Moist Heat      Moist Heat Therapy   Number Minutes Moist Heat  15 Minutes    Moist Heat Location  Lumbar Spine      Electrical Stimulation   Electrical Stimulation Location  R low back; R anterior tibialis    Electrical Stimulation Action  Pre-Mod; VMS    Electrical Stimulation Parameters  80-150 hz x15 min; 10/10, 50 pps, 5 sec ramp, 300 usec x15 min    Electrical Stimulation Goals  Pain;Neuromuscular facilitation                  PT Long Term Goals - 10/12/17 1801      PT LONG TERM GOAL #1   Title  Independent with a HEP.    Time  6    Period  Weeks    Status  Achieved       PT LONG TERM GOAL #2   Title  Restore full left ankle AROM.    Time  6    Period  Weeks    Status  On-going      PT LONG TERM GOAL #3   Title  Left ankle strength to 4+/5.    Time  6    Period  Weeks    Status  On-going      PT LONG TERM GOAL #4   Title  Walk without an assistive device and normal gait pattern.    Time  6    Period  Weeks    Status  On-going      PT LONG TERM GOAL #5   Title  Perform ADL's with pain not > 2-3/10.    Time  6    Period  Weeks    Status  On-going            Plan - 10/17/17 1154    Clinical Impression Statement  Patient tolerated today's treatment well although he is still limited with L ankle control due to foot drop. Patient able to tolerate standing exercises although limited due to decreased stamina per patient report. No L ankle DF to neutral avaliable during standing exercises. Normal modalities response noted following removal of the modalities. No benefits noted from PT per patient report.    Rehab Potential  Fair    Clinical Impairments Affecting Rehab Potential  Uncertain rehab potential due to nerve damage from work injury.    PT Frequency  2x / week    PT Duration  6 weeks    PT Treatment/Interventions  ADLs/Self Care Home Management;Electrical Stimulation;Ultrasound;Functional mobility training;Therapeutic activities;Therapeutic exercise;Balance training;Neuromuscular re-education;Patient/family education;Passive range of motion;Manual techniques    PT Next Visit Plan  Cont standing hip strengthening, VMS to ant tib. Please follow laminecetomy protocol; try e'stim to low back and left lower leg to restore left ankle function.  Seated BAPS.    Consulted and Agree with Plan of Care  Patient       Patient will benefit from skilled therapeutic intervention in order to improve the following deficits and impairments:  Abnormal gait, Decreased activity tolerance, Decreased strength, Pain  Visit Diagnosis: Foot  drop,  left  Muscle weakness (generalized)  Acute bilateral low back pain with left-sided sciatica     Problem List Patient Active Problem List   Diagnosis Date Noted  . Dyspnea on exertion 02/09/2017  . Diabetes mellitus without complication ([place]) 16/38/4536  . History of gastric bypass 02/07/2017  . OSA (obstructive sleep apnea) 02/07/2017  . Obesity (BMI 30-39.9) 02/07/2017  . Hypogonadism in male 02/07/2017    Standley Brooking, PTA 10/17/2017, 12:10 PM  [facility] [place], [name], [address] Phone: [phone]   Fax:  [phone]  Name: Aaron Shah MRN: [id number] Date of Birth: [birth year]

## 2017-10-19 ENCOUNTER — Ambulatory Visit: Payer: Medicare HMO | Admitting: *Deleted

## 2017-10-19 DIAGNOSIS — M21372 Foot drop, left foot: Secondary | ICD-10-CM | POA: Diagnosis not present

## 2017-10-19 DIAGNOSIS — M5442 Lumbago with sciatica, left side: Secondary | ICD-10-CM

## 2017-10-19 DIAGNOSIS — M6281 Muscle weakness (generalized): Secondary | ICD-10-CM

## 2017-10-19 NOTE — Therapy (Signed)
[facility], [name], [address] Phone: [phone]   Fax:  [phone]  Physical Therapy Treatment  Patient Details  Name: Aaron Shah MRN: [id number] Date of Birth: 05-25-1950 Referring Provider: Consuella Lose MD.   Encounter Date: 10/19/2017  PT End of Session - 10/19/17 1123    Visit Number  12    Number of Visits  12    Date for PT Re-Evaluation  10/25/17    PT Start Time  1030    PT Stop Time  1130    PT Time Calculation (min)  60 min       Past Medical History:  Diagnosis Date  . Arthritis    entire spine, C4,5,6- HNP, seeing MD at [facility]   . Asthma    h/o- uses Proventil when he has a flare   . Cataract   . Concussion 05/2013   resulted from a fall, had been rx for depression after that but developed feelings of homicide & suicide, med. was d/c'd  . Diabetes mellitus without complication ([place]) 1324   oral med.   . Diabetes mellitus without complication ([place])   . H/O exercise stress test 2016   pt,. reports that it was done thru the [place] in [place]  . Hypertension   . Pneumonia 1970's   hosp. in [employer], pt. reports that he is prone to getting bronchitis   . Sleep apnea 2016   CPAP machine but not using at present, test done in [place] , will have a new study here in [place]     Past Surgical History:  Procedure Laterality Date  . CATARACT EXTRACTION W/PHACO Left 08/19/2015   Procedure: CATARACT EXTRACTION PHACO AND INTRAOCULAR LENS PLACEMENT (IOC) LEFT EYE;  Surgeon: Marylynn Pearson, MD;  Location: [facility];  Service: Ophthalmology;  Laterality: Left;  . EYE SURGERY     glass removed fr. R eye   . GASTRIC BYPASS  12/2004   [place], [place], weight was 400lbs. prior to   . HERNIA REPAIR     inguinal- L   . IRRIGATION AND DEBRIDEMENT SEBACEOUS CYST Right    arm- upper, cysts both removed  . LEFT HEART CATH AND CORONARY ANGIOGRAPHY N/A 02/15/2017   Procedure: LEFT  HEART CATH AND CORONARY ANGIOGRAPHY;  Surgeon: Leonie Man, MD;  Location: [facility];  Service: Cardiovascular;  Laterality: N/A;  . TESTICLE REMOVAL  1960's, 2001   both removed   . TONSILLECTOMY      There were no vitals filed for this visit.  Subjective Assessment - 10/19/17 1050    Subjective  Got his shoes for his AFOs. Both feet are numbness    Pertinent History  Concussion in past.  2 recent falls.    Limitations  Walking    How long can you walk comfortably?  Short distances with a FWW.    Diagnostic tests  X-ray and MRI.    Patient Stated Goals  See above.    Currently in Pain?  Yes    Pain Score  4     Pain Location  Back    Pain Orientation  Right    Pain Descriptors / Indicators  Aching    Pain Type  Acute pain    Pain Frequency  Intermittent                       [facility] Adult PT Treatment/Exercise - 10/19/17 0001  Knee/Hip Exercises: Aerobic   Nustep  L5-7 x15 min      Knee/Hip Exercises: Standing   Hip Flexion  AROM;Both;3 sets;10 reps;Knee bent with drawin and good posture     Hip Abduction  AROM;Both;3 sets;10 reps;Knee straight with drawin for core    Rocker Board  5 minutes for balance/ neuro Re-ed      Knee/Hip Exercises: Seated   Clamshell with TheraBand  Red 2x15      Modalities   Modalities  Electrical Stimulation;Moist Heat      Moist Heat Therapy   Number Minutes Moist Heat  15 Minutes    Moist Heat Location  Lumbar Spine      Electrical Stimulation   Electrical Stimulation Location  R low back; Premod 80-'150hz'$  x 15 mins sitting    Electrical Stimulation Action  VMS x15 mins to LT AT while sitting and performing RB DF    Electrical Stimulation Goals  Pain;Neuromuscular facilitation                  PT Long Term Goals - 10/19/17 1136      PT LONG TERM GOAL #1   Title  Independent with a HEP.    Time  6    Period  Weeks    Status  Achieved      PT LONG TERM GOAL #2   Title  Restore full left  ankle AROM.    Time  6    Period  Weeks    Status  Not Met      PT LONG TERM GOAL #4   Title  Walk without an assistive device and normal gait pattern.    Time  6    Period  Weeks      PT LONG TERM GOAL #5   Title  Perform ADL's with pain not > 2-3/10.    Time  6    Period  Weeks    Status  On-going            Plan - 10/19/17 1125    Clinical Impression Statement  Pt arrived today doing fair. He had minimal LT side LBP, but has noticed that his  RT side is getting worse. Pt was very tender RT SIJ and reports increased pain with Walking. Pt still uses FWW to ambulate due to LT ankle foot drop. Pt did well with Rx today and was able to complete balance , core and neuro Re-ed. Pt feels that he is able to do better with steps and some ADL's since starting PT, but LT ankle DF active movement hasn't improved. VMS is being used for mm facillitation still for anterior tibialis while performing Rockerboard.. Normal modalitiy response to LB after removal with decreased pain RT side LB.. Pt has been measured for AFO and should receive it towards the end of the month.    Clinical Presentation  Unstable    Clinical Decision Making  Moderate    Rehab Potential  Fair    Clinical Impairments Affecting Rehab Potential  Uncertain rehab potential due to nerve damage from work injury.    PT Frequency  2x / week    PT Duration  6 weeks    PT Treatment/Interventions  ADLs/Self Care Home Management;Electrical Stimulation;Ultrasound;Functional mobility training;Therapeutic activities;Therapeutic exercise;Balance training;Neuromuscular re-education;Patient/family education;Passive range of motion;Manual techniques    PT Next Visit Plan  Cont standing hip strengthening, VMS to ant tib. Please follow laminecetomy protocol; try e'stim to low back and left lower leg to  restore left ankle function.  Seated BAPS.       Patient will benefit from skilled therapeutic intervention in order to improve the following  deficits and impairments:  Abnormal gait, Decreased activity tolerance, Decreased strength, Pain  Visit Diagnosis: Muscle weakness (generalized)  Foot drop, left  Acute bilateral low back pain with left-sided sciatica     Problem List Patient Active Problem List   Diagnosis Date Noted  . Dyspnea on exertion 02/09/2017  . Diabetes mellitus without complication ([place]) 57/49/3552  . History of gastric bypass 02/07/2017  . OSA (obstructive sleep apnea) 02/07/2017  . Obesity (BMI 30-39.9) 02/07/2017  . Hypogonadism in male 02/07/2017    Whitni Pasquini,CHRIS, PTA 10/19/2017, 4:43 PM  [facility] [address] [place], [name], [address] Phone: [phone]   Fax:  [phone]  Name: Aaron Shah MRN: [id number] Date of Birth: 1951-03-22

## 2017-11-01 DIAGNOSIS — E1140 Type 2 diabetes mellitus with diabetic neuropathy, unspecified: Secondary | ICD-10-CM | POA: Diagnosis not present

## 2017-11-01 DIAGNOSIS — M21372 Foot drop, left foot: Secondary | ICD-10-CM | POA: Diagnosis not present

## 2017-11-07 ENCOUNTER — Ambulatory Visit: Payer: Medicare HMO | Admitting: *Deleted

## 2017-11-08 ENCOUNTER — Ambulatory Visit: Payer: Medicare HMO | Attending: Neurosurgery | Admitting: Physical Therapy

## 2017-11-08 ENCOUNTER — Encounter: Payer: Self-pay | Admitting: Physical Therapy

## 2017-11-08 DIAGNOSIS — M6281 Muscle weakness (generalized): Secondary | ICD-10-CM | POA: Diagnosis not present

## 2017-11-08 DIAGNOSIS — M21372 Foot drop, left foot: Secondary | ICD-10-CM | POA: Diagnosis not present

## 2017-11-08 DIAGNOSIS — M5442 Lumbago with sciatica, left side: Secondary | ICD-10-CM

## 2017-11-08 NOTE — Therapy (Signed)
[facility] [place], [name], [address] Phone: [phone]   Fax:  [phone]  Physical Therapy Treatment  Patient Details  Name: Aaron Shah MRN: [id number] Date of Birth: [birth year] Referring Provider: Consuella Lose MD.   Encounter Date: 11/08/2017  PT End of Session - 11/08/17 1436    Visit Number  13    Number of Visits  18    Date for PT Re-Evaluation  11/17/17    PT Start Time  1401    PT Stop Time  1444    PT Time Calculation (min)  43 min    Activity Tolerance  Patient tolerated treatment well    Behavior During Therapy  [facility] for tasks assessed/performed       Past Medical History:  Diagnosis Date  . Arthritis    entire spine, C4,5,6- HNP, seeing MD at [facility]   . Asthma    h/o- uses Proventil when he has a flare   . Cataract   . Concussion 05/2013   resulted from a fall, had been rx for depression after that but developed feelings of homicide & suicide, med. was d/c'd  . Diabetes mellitus without complication ([place]) 7017   oral med.   . Diabetes mellitus without complication ([place])   . H/O exercise stress test 2016   pt,. reports that it was done thru the [place] in [place]  . Hypertension   . Pneumonia 1970's   hosp. [place] [employer], pt. reports that he is prone to getting bronchitis   . Sleep apnea 2016   CPAP machine but not using at present, test done in [place] , will have a new study here in [place]     Past Surgical History:  Procedure Laterality Date  . CATARACT EXTRACTION W/PHACO Left 08/19/2015   Procedure: CATARACT EXTRACTION PHACO AND INTRAOCULAR LENS PLACEMENT (IOC) LEFT EYE;  Surgeon: Marylynn Pearson, MD;  Location: [facility];  Service: Ophthalmology;  Laterality: Left;  . EYE SURGERY     glass removed fr. R eye   . GASTRIC BYPASS  12/2004   [place], [place], weight was 400lbs. prior to   . HERNIA REPAIR     inguinal- L   . IRRIGATION AND DEBRIDEMENT SEBACEOUS CYST Right     arm- upper, cysts both removed  . LEFT HEART CATH AND CORONARY ANGIOGRAPHY N/A 02/15/2017   Procedure: LEFT HEART CATH AND CORONARY ANGIOGRAPHY;  Surgeon: Leonie Man, MD;  Location: [facility];  Service: Cardiovascular;  Laterality: N/A;  . TESTICLE REMOVAL  1960's, 2001   both removed   . TONSILLECTOMY      There were no vitals filed for this visit.  Subjective Assessment - 11/08/17 1402    Subjective  Patient arrived with c/o uncomfortable with new AFO    Pertinent History  Concussion in past.  2 recent falls.    Limitations  Walking    How long can you walk comfortably?  Short distances with a FWW.    Diagnostic tests  X-ray and MRI.    Patient Stated Goals  See above.    Currently in Pain?  Yes    Pain Score  1     Pain Location  Back    Pain Orientation  Right    Pain Descriptors / Indicators  Aching    Pain Type  Acute pain    Pain Frequency  Intermittent    Aggravating Factors   increased activity    Pain  Relieving Factors  rest                       [facility] Adult PT Treatment/Exercise - 11/08/17 0001      Knee/Hip Exercises: Aerobic   Nustep  L5-7 x15 min, monitored      Moist Heat Therapy   Number Minutes Moist Heat  15 Minutes    Moist Heat Location  Lumbar Spine      Electrical Stimulation   Electrical Stimulation Action  VMS to LT AT with DF with rockerboard 10/10 x70mn    Electrical Stimulation Goals  Neuromuscular facilitation      Manual Therapy   Manual Therapy  Passive ROM    Passive ROM  manual DF stretching for left LE with knee straight and bent                  PT Long Term Goals - 10/19/17 1136      PT LONG TERM GOAL #1   Title  Independent with a HEP.    Time  6    Period  Weeks    Status  Achieved      PT LONG TERM GOAL #2   Title  Restore full left ankle AROM.    Time  6    Period  Weeks    Status  Not Met      PT LONG TERM GOAL #4   Title  Walk without an assistive device and normal gait  pattern.    Time  6    Period  Weeks      PT LONG TERM GOAL #5   Title  Perform ADL's with pain not > 2-3/10.    Time  6    Period  Weeks    Status  On-going            Plan - 11/08/17 1443    Clinical Impression Statement  Patient tolerated treatment well today. Patient has some decreased pain in back since he has had AFO on, AFO has caused some discomfort getting used to it. Patient unable to perform DF active at this time. Today focused on manual stretching to calf and DF to help inprove ROM and decrease tightness. Patient would benefit from gait training with cane when adjusted to AFO. Goals ongoing.     Rehab Potential  Fair    Clinical Impairments Affecting Rehab Potential  Uncertain rehab potential due to nerve damage from work injury.    PT Frequency  2x / week    PT Duration  6 weeks    PT Treatment/Interventions  ADLs/Self Care Home Management;Electrical Stimulation;Ultrasound;Functional mobility training;Therapeutic activities;Therapeutic exercise;Balance training;Neuromuscular re-education;Patient/family education;Passive range of motion;Manual techniques    PT Next Visit Plan  Cont standing hip strengthening, VMS to ant tib. Please follow laminecetomy protocol; try e'stim to low back and left lower leg to restore left ankle function.  Seated BAPS. transition to cane    Consulted and Agree with Plan of Care  Patient       Patient will benefit from skilled therapeutic intervention in order to improve the following deficits and impairments:  Abnormal gait, Decreased activity tolerance, Decreased strength, Pain  Visit Diagnosis: Muscle weakness (generalized)  Foot drop, left  Acute bilateral low back pain with left-sided sciatica     Problem List Patient Active Problem List   Diagnosis Date Noted  . Dyspnea on exertion 02/09/2017  . Diabetes mellitus without complication ([place] 152/84/1324 . History of  gastric bypass 02/07/2017  . OSA (obstructive sleep apnea)  02/07/2017  . Obesity (BMI 30-39.9) 02/07/2017  . Hypogonadism in male 02/07/2017    Phillips Climes, PTA 11/08/2017, 2:51 PM  [facility] [place], [name], [address] Phone: [phone]   Fax:  [phone]  Name: Asael Pann MRN: [id number] Date of Birth: Mar 20, 1951

## 2017-11-13 ENCOUNTER — Encounter: Payer: Self-pay | Admitting: Physical Therapy

## 2017-11-14 ENCOUNTER — Encounter: Payer: Self-pay | Admitting: Family Medicine

## 2017-11-14 ENCOUNTER — Ambulatory Visit: Payer: Medicare HMO | Admitting: Physical Therapy

## 2017-11-14 ENCOUNTER — Ambulatory Visit (INDEPENDENT_AMBULATORY_CARE_PROVIDER_SITE_OTHER): Payer: Medicare HMO | Admitting: Family Medicine

## 2017-11-14 VITALS — BP 136/75 | Temp 97.9°F | Ht 71.0 in | Wt 266.0 lb

## 2017-11-14 DIAGNOSIS — M5442 Lumbago with sciatica, left side: Secondary | ICD-10-CM | POA: Diagnosis not present

## 2017-11-14 DIAGNOSIS — M21372 Foot drop, left foot: Secondary | ICD-10-CM

## 2017-11-14 DIAGNOSIS — H60541 Acute eczematoid otitis externa, right ear: Secondary | ICD-10-CM

## 2017-11-14 DIAGNOSIS — M6281 Muscle weakness (generalized): Secondary | ICD-10-CM

## 2017-11-14 DIAGNOSIS — H6122 Impacted cerumen, left ear: Secondary | ICD-10-CM

## 2017-11-14 NOTE — Patient Instructions (Signed)
Use a small amount of mineral oil applied to both ears to help with itching and dry skin.   Earwax Buildup, Adult The ears produce a substance called earwax that helps keep bacteria out of the ear and protects the skin in the ear canal. Occasionally, earwax can build up in the ear and cause discomfort or hearing loss. What increases the risk? This condition is more likely to develop in people who:  Are male.  Are elderly.  Naturally produce more earwax.  Clean their ears often with cotton swabs.  Use earplugs often.  Use in-ear headphones often.  Wear hearing aids.  Have narrow ear canals.  Have earwax that is overly thick or sticky.  Have eczema.  Are dehydrated.  Have excess hair in the ear canal.  What are the signs or symptoms? Symptoms of this condition include:  Reduced or muffled hearing.  A feeling of fullness in the ear or feeling that the ear is plugged.  Fluid coming from the ear.  Ear pain.  Ear itch.  Ringing in the ear.  Coughing.  An obvious piece of earwax that can be seen inside the ear canal.  How is this diagnosed? This condition may be diagnosed based on:  Your symptoms.  Your medical history.  An ear exam. During the exam, your health care provider will look into your ear with an instrument called an otoscope.  You may have tests, including a hearing test. How is this treated? This condition may be treated by:  Using ear drops to soften the earwax.  Having the earwax removed by a health care provider. The health care provider may: ? Flush the ear with water. ? Use an instrument that has a loop on the end (curette). ? Use a suction device.  Surgery to remove the wax buildup. This may be done in severe cases.  Follow these instructions at home:  Take over-the-counter and prescription medicines only as told by your health care provider.  Do not put any objects, including cotton swabs, into your ear. You can clean the  opening of your ear canal with a washcloth or facial tissue.  Follow instructions from your health care provider about cleaning your ears. Do not over-clean your ears.  Drink enough fluid to keep your urine clear or pale yellow. This will help to thin the earwax.  Keep all follow-up visits as told by your health care provider. If earwax builds up in your ears often or if you use hearing aids, consider seeing your health care provider for routine, preventive ear cleanings. Ask your health care provider how often you should schedule your cleanings.  If you have hearing aids, clean them according to instructions from the manufacturer and your health care provider. Contact a health care provider if:  You have ear pain.  You develop a fever.  You have blood, pus, or other fluid coming from your ear.  You have hearing loss.  You have ringing in your ears that does not go away.  Your symptoms do not improve with treatment.  You feel like the room is spinning (vertigo). Summary  Earwax can build up in the ear and cause discomfort or hearing loss.  The most common symptoms of this condition include reduced or muffled hearing and a feeling of fullness in the ear or feeling that the ear is plugged.  This condition may be diagnosed based on your symptoms, your medical history, and an ear exam.  This condition may be treated by  using ear drops to soften the earwax or by having the earwax removed by a health care provider.  Do not put any objects, including cotton swabs, into your ear. You can clean the opening of your ear canal with a washcloth or facial tissue. This information is not intended to replace advice given to you by your health care provider. Make sure you discuss any questions you have with your health care provider. Document Released: 06/02/2004 Document Revised: 07/06/2016 Document Reviewed: 07/06/2016 Elsevier Interactive Patient Education  [employer].

## 2017-11-14 NOTE — Progress Notes (Signed)
Subjective: CC:ear fullness PCP: Eustaquio Maize, MD Aaron Shah is a 67 y.o. male presenting to clinic today for:  1. Ear fullness Patient reports sensation of bilateral ear fullness with "gurgling in his ears" when he opens and closes his jaw.  He notes that he has tinnitus and decreased hearing bilaterally.  He wears hearing aids.  He reports onset of symptoms about 2 weeks ago.  He reports that he frequently has to have his ears irrigated for excessive cerumen buildup.  No fevers, discharge from ears.  He does report some ear itching.   ROS: Per HPI  No Known Allergies Past Medical History:  Diagnosis Date  . Arthritis    entire spine, C4,5,6- HNP, seeing MD at [facility]   . Asthma    h/o- uses Proventil when he has a flare   . Cataract   . Concussion 05/2013   resulted from a fall, had been rx for depression after that but developed feelings of homicide & suicide, med. was d/c'd  . Diabetes mellitus without complication ([place]) 2778   oral med.   . Diabetes mellitus without complication ([place])   . H/O exercise stress test 2016   pt,. reports that it was done thru the [place] in [place]  . Hypertension   . Pneumonia 1970's   hosp. in [employer], pt. reports that he is prone to getting bronchitis   . Sleep apnea 2016   CPAP machine but not using at present, test done in [place] , will have a new study here in [employer]     Current Outpatient Medications:  .  albuterol (PROVENTIL HFA;VENTOLIN HFA) 108 (90 Base) MCG/ACT inhaler, Inhale 1 puff into the lungs every 6 (six) hours as needed for wheezing or shortness of breath. , Disp: , Rfl:  .  colchicine 0.6 MG tablet, Take 0.12 mg by mouth every evening. , Disp: , Rfl:  .  cyclobenzaprine (FLEXERIL) 10 MG tablet, Take 1 tablet (10 mg total) by mouth 2 (two) times daily as needed for muscle spasms., Disp: 45 tablet, Rfl: 1 .  hydrocortisone (ANUSOL-HC) 25 MG suppository, Place 1 suppository (25 mg total)  rectally 2 (two) times daily., Disp: 12 suppository, Rfl: 0 .  ibuprofen (ADVIL,MOTRIN) 400 MG tablet, Take 400 mg by mouth every 6 (six) hours as needed for mild pain or moderate pain. , Disp: , Rfl:  .  lisinopril (PRINIVIL,ZESTRIL) 10 MG tablet, Take 10 mg by mouth daily., Disp: , Rfl:  .  metFORMIN (GLUCOPHAGE) 500 MG tablet, Take 250 mg by mouth 2 (two) times daily with a meal., Disp: , Rfl:  .  Multiple Vitamin (MULTIVITAMIN WITH MINERALS) TABS tablet, Take 1 tablet by mouth daily., Disp: , Rfl:  .  pantoprazole (PROTONIX) 40 MG tablet, Take 1 tablet (40 mg total) by mouth daily., Disp: 30 tablet, Rfl: 3 .  pyridOXINE (VITAMIN B-6) 100 MG tablet, Take 100 mg by mouth daily., Disp: , Rfl:  .  testosterone cypionate (DEPOTESTOSTERONE CYPIONATE) 200 MG/ML injection, Inject 200 mg into the muscle every 14 (fourteen) days. 47ml. q 2 weeks , Disp: , Rfl:  .  vitamin B-12 (CYANOCOBALAMIN) 1000 MCG tablet, Take 1,000 mcg by mouth daily., Disp: , Rfl:  Social History   Socioeconomic History  . Marital status: Married    Spouse name: Not on file  . Number of children: Not on file  . Years of education: Not on file  . Highest education level: Not on file  Occupational  History  . Not on file  Social Needs  . Financial resource strain: Not on file  . Food insecurity:    Worry: Not on file    Inability: Not on file  . Transportation needs:    Medical: Not on file    Non-medical: Not on file  Tobacco Use  . Smoking status: Never Smoker  . Smokeless tobacco: Never Used  Substance and Sexual Activity  . Alcohol use: Yes    Comment: rare use of beer  . Drug use: No  . Sexual activity: Not on file  Lifestyle  . Physical activity:    Days per week: Not on file    Minutes per session: Not on file  . Stress: Not on file  Relationships  . Social connections:    Talks on phone: Not on file    Gets together: Not on file    Attends religious service: Not on file    Active member of club or  organization: Not on file    Attends meetings of clubs or organizations: Not on file    Relationship status: Not on file  . Intimate partner violence:    Fear of current or ex partner: Not on file    Emotionally abused: Not on file    Physically abused: Not on file    Forced sexual activity: Not on file  Other Topics Concern  . Not on file  Social History Narrative   ** Merged History Encounter **       Family History  Problem Relation Age of Onset  . Cancer Mother        Lung  . Cancer Father        Kidney, Lung  . Emphysema Father   . COPD Father   . Cancer Sister        Skin   . Cancer Paternal Grandfather        Intestinal    Objective: Office vital signs reviewed. BP 136/75   Temp 97.9 F (36.6 C) (Oral)   Ht 5\' 11"  (1.803 m)   Wt 266 lb (120.7 kg)   BMI 37.10 kg/m   Physical Examination:  General: Awake, alert, well nourished, No acute distress HEENT: Normal    Neck: No masses palpated. No lymphadenopathy    Ears: R Tympanic membranes intact, crepey texture of TM w/ dulled light reflex, no erythema, no bulging; Left TM occluded by cerumen.    Eyes: PERRLA, extraocular membranes intact, sclera white    Nose: nasal turbinates moist, no nasal discharge    Throat: moist mucus membranes, no erythema, no tonsillar exudate.  Airway is patent  Assessment/ Plan: 67 y.o. male   1. Impacted cerumen of left ear Ear was successfully irrigated here in office.  TM without evidence of infection.  I recommended that he use mineral oil applied to bilateral external auditory canals to help with dry skin and itching.  Follow-up as needed.  2. Dermatitis of ear canal, right   Janora Norlander, [place] [phone]

## 2017-11-14 NOTE — Therapy (Signed)
[facility] [place], [name], [address] Phone: [phone]   Fax:  [phone]  Physical Therapy Treatment  Patient Details  Name: Aaron Shah MRN: [id number] Date of Birth: February 10, 1951 Referring Provider: Consuella Lose MD.   Encounter Date: 11/14/2017  PT End of Session - 11/14/17 1543    Visit Number  14    Number of Visits  18    Date for PT Re-Evaluation  11/17/17    PT Start Time  0235    PT Stop Time  0312    PT Time Calculation (min)  37 min       Past Medical History:  Diagnosis Date  . Arthritis    entire spine, C4,5,6- HNP, seeing MD at [facility]   . Asthma    h/o- uses Proventil when he has a flare   . Cataract   . Concussion 05/2013   resulted from a fall, had been rx for depression after that but developed feelings of homicide & suicide, med. was d/c'd  . Diabetes mellitus without complication ([place]) 6948   oral med.   . Diabetes mellitus without complication ([place])   . H/O exercise stress test 2016   pt,. reports that it was done thru the [place] in [place]  . Hypertension   . Pneumonia 1970's   hosp. in [employer], pt. reports that he is prone to getting bronchitis   . Sleep apnea 2016   CPAP machine but not using at present, test done in [place] , will have a new study here in [place]     Past Surgical History:  Procedure Laterality Date  . CATARACT EXTRACTION W/PHACO Left 08/19/2015   Procedure: CATARACT EXTRACTION PHACO AND INTRAOCULAR LENS PLACEMENT (IOC) LEFT EYE;  Surgeon: Marylynn Pearson, MD;  Location: [place];  Service: Ophthalmology;  Laterality: Left;  . EYE SURGERY     glass removed fr. R eye   . GASTRIC BYPASS  12/2004   [place], [place], weight was 400lbs. prior to   . HERNIA REPAIR     inguinal- L   . IRRIGATION AND DEBRIDEMENT SEBACEOUS CYST Right    arm- upper, cysts both removed  . LEFT HEART CATH AND CORONARY ANGIOGRAPHY N/A 02/15/2017   Procedure: LEFT HEART  CATH AND CORONARY ANGIOGRAPHY;  Surgeon: Leonie Man, MD;  Location: [place] [facility];  Service: Cardiovascular;  Laterality: N/A;  . TESTICLE REMOVAL  1960's, 2001   both removed   . TONSILLECTOMY      There were no vitals filed for this visit.  Subjective Assessment - 11/14/17 1544    Subjective  Been doing the ankle exercises at home.  Back feels better since getting AFO.    Currently in Pain?  Yes    Pain Score  1     Pain Location  Back                       [facility] Adult PT Treatment/Exercise - 11/14/17 0001      Exercises   Exercises  Knee/Hip      Knee/Hip Exercises: Aerobic   Nustep  Level 6 x 15 minutes.      Modalities   Modalities  Electrical engineer Stimulation Location  Biphasic e'stim to left low back and left tib ant x 15 minutes.  PT Long Term Goals - 10/19/17 1136      PT LONG TERM GOAL #1   Title  Independent with a HEP.    Time  6    Period  Weeks    Status  Achieved      PT LONG TERM GOAL #2   Title  Restore full left ankle AROM.    Time  6    Period  Weeks    Status  Not Met      PT LONG TERM GOAL #4   Title  Walk without an assistive device and normal gait pattern.    Time  6    Period  Weeks      PT LONG TERM GOAL #5   Title  Perform ADL's with pain not > 2-3/10.    Time  6    Period  Weeks    Status  On-going            Plan - 11/14/17 1549    Clinical Impression Statement  Patient with AFO today and ambulating with a straight cane.  No return of left ankle dorsiflexion visualized when patient doffed AFO.       Patient will benefit from skilled therapeutic intervention in order to improve the following deficits and impairments:     Visit Diagnosis: Muscle weakness (generalized)  Foot drop, left  Acute bilateral low back pain with left-sided sciatica     Problem List Patient Active Problem List   Diagnosis Date Noted  .  Dyspnea on exertion 02/09/2017  . Diabetes mellitus without complication ([place]) 31/59/4585  . History of gastric bypass 02/07/2017  . OSA (obstructive sleep apnea) 02/07/2017  . Obesity (BMI 30-39.9) 02/07/2017  . Hypogonadism in male 02/07/2017    Makaelah Cranfield, [place] MPT 11/14/2017, 3:52 PM  [facility] [address] [place], [name], [address] Phone: [phone]   Fax:  [phone]  Name: Aaron Shah MRN: [id number] Date of Birth: [birth year]

## 2017-11-16 ENCOUNTER — Ambulatory Visit (INDEPENDENT_AMBULATORY_CARE_PROVIDER_SITE_OTHER): Payer: Medicare HMO | Admitting: Pediatrics

## 2017-11-16 ENCOUNTER — Encounter: Payer: Self-pay | Admitting: *Deleted

## 2017-11-16 ENCOUNTER — Encounter: Payer: Self-pay | Admitting: Pediatrics

## 2017-11-16 VITALS — BP 121/72 | HR 61 | Temp 98.3°F | Ht 71.0 in | Wt 268.0 lb

## 2017-11-16 DIAGNOSIS — I10 Essential (primary) hypertension: Secondary | ICD-10-CM

## 2017-11-16 DIAGNOSIS — E119 Type 2 diabetes mellitus without complications: Secondary | ICD-10-CM

## 2017-11-16 DIAGNOSIS — K219 Gastro-esophageal reflux disease without esophagitis: Secondary | ICD-10-CM | POA: Diagnosis not present

## 2017-11-16 NOTE — Progress Notes (Signed)
  Subjective:   Patient ID: Aaron Shah, male    DOB: [birth year], 67 y.o.   MRN: [id number] CC: Results (rck) and Gastroesophageal Reflux  HPI: Aaron Shah is a 67 y.o. male   GERD: Taking Nexium regularly. pepcid, did not help, nexium is helping, [facility]  DM2: Taking metformin regularly.  Trying to avoid sugary foods.  Hypertension: Taking medicine regularly, no chest pain, shortness of breath, lightheadedness.  Wearing brace on left foot for foot drop. Not tripping as much but the brace is cumbersome.  Relevant past medical, surgical, family and social history reviewed. Allergies and medications reviewed and updated. Social History   Tobacco Use  Smoking Status Never Smoker  Smokeless Tobacco Never Used   ROS: Per HPI   Objective:    BP 121/72   Pulse 61   Temp 98.3 F (36.8 C) (Oral)   Ht 5\' 11"  (1.803 m)   Wt 268 lb (121.6 kg)   BMI 37.38 kg/m   Wt Readings from Last 3 Encounters:  11/16/17 268 lb (121.6 kg)  11/14/17 266 lb (120.7 kg)  10/11/17 265 lb 12.8 oz (120.6 kg)    Gen: NAD, alert, cooperative with exam, NCAT EYES: EOMI, no conjunctival injection, or no icterus ENT:  TMs pearly gray b/l, OP without erythema CV: NRRR, normal S1/S2 Resp: CTABL, no wheezes, normal WOB Abd: +BS, soft, NTND.  Ext: No edema, warm Neuro: Alert and oriented MSK: normal muscle bulk  Assessment & Plan:  Aaron Shah was seen today for results and gastroesophageal reflux.  Diagnoses and all orders for this visit:  Essential hypertension Well-controlled, continue current medicines  Gastroesophageal reflux disease, esophagitis presence not specified Improved control on next see him, continue current medicine.  EGD and colonoscopy upcoming with the [facility].  Diabetes mellitus without complication (HCC) W2N recently 6.7.  Continue metformin, avoiding sugary foods.  Follow up plan: Return in about 3 months (around 02/16/2018). Assunta Found,  MD [place]

## 2017-11-17 ENCOUNTER — Ambulatory Visit: Payer: Medicare HMO | Admitting: Physical Therapy

## 2017-11-17 DIAGNOSIS — M21372 Foot drop, left foot: Secondary | ICD-10-CM

## 2017-11-17 DIAGNOSIS — M6281 Muscle weakness (generalized): Secondary | ICD-10-CM | POA: Diagnosis not present

## 2017-11-17 DIAGNOSIS — M5442 Lumbago with sciatica, left side: Secondary | ICD-10-CM | POA: Diagnosis not present

## 2017-11-17 NOTE — Therapy (Signed)
[facility] [place], [name], [address] Phone: [phone]   Fax:  [phone]  Physical Therapy Treatment  Patient Details  Name: Aaron Shah MRN: [id number] Date of Birth: April 01, 1951 Referring Provider: Consuella Lose MD.   Encounter Date: 11/17/2017  PT End of Session - 11/17/17 0959    Visit Number  15    Number of Visits  18    Date for PT Re-Evaluation  11/17/17    PT Start Time  0956    PT Stop Time  1039    PT Time Calculation (min)  43 min    Activity Tolerance  Patient tolerated treatment well    Behavior During Therapy  [facility] for tasks assessed/performed       Past Medical History:  Diagnosis Date  . Arthritis    entire spine, C4,5,6- HNP, seeing MD at [facility]   . Asthma    h/o- uses Proventil when he has a flare   . Cataract   . Concussion 05/2013   resulted from a fall, had been rx for depression after that but developed feelings of homicide & suicide, med. was d/c'd  . Diabetes mellitus without complication ([place]) 1517   oral med.   . Diabetes mellitus without complication ([place])   . H/O exercise stress test 2016   pt,. reports that it was done thru the [place] in [place]  . Hypertension   . Pneumonia 1970's   hosp. [place] [employer], pt. reports that he is prone to getting bronchitis   . Sleep apnea 2016   CPAP machine but not using at present, test done in [place] , will have a new study here in [place]     Past Surgical History:  Procedure Laterality Date  . CATARACT EXTRACTION W/PHACO Left 08/19/2015   Procedure: CATARACT EXTRACTION PHACO AND INTRAOCULAR LENS PLACEMENT (IOC) LEFT EYE;  Surgeon: Marylynn Pearson, MD;  Location: [facility];  Service: Ophthalmology;  Laterality: Left;  . EYE SURGERY     glass removed fr. R eye   . GASTRIC BYPASS  12/2004   [place], [place], weight was 400lbs. prior to   . HERNIA REPAIR     inguinal- L   . IRRIGATION AND DEBRIDEMENT SEBACEOUS CYST Right     arm- upper, cysts both removed  . LEFT HEART CATH AND CORONARY ANGIOGRAPHY N/A 02/15/2017   Procedure: LEFT HEART CATH AND CORONARY ANGIOGRAPHY;  Surgeon: Leonie Man, MD;  Location: [facility];  Service: Cardiovascular;  Laterality: N/A;  . TESTICLE REMOVAL  1960's, 2001   both removed   . TONSILLECTOMY      There were no vitals filed for this visit.  Subjective Assessment - 11/17/17 0959    Subjective  Patient reports he's only been able to tolerate a few hours with the AFO before he needs to remove it.     Pertinent History  Concussion in past.  2 recent falls.    Limitations  Walking    How long can you walk comfortably?  Short distances with a FWW.    Diagnostic tests  X-ray and MRI.    Patient Stated Goals  See above.    Currently in Pain?  No/denies         [facility] PT Assessment - 11/17/17 0001      Assessment   Medical Diagnosis  Left foot drop.      Precautions   Precautions  Fall  [place] Adult PT Treatment/Exercise - 11/17/17 0001      Exercises   Exercises  Lumbar      Lumbar Exercises: Seated   Other Seated Lumbar Exercises  abdominal crunches x30      Modalities   Modalities  Electrical Stimulation      Moist Heat Therapy   Number Minutes Moist Heat  15 Minutes    Moist Heat Location  Lumbar Spine      Electrical Stimulation   Electrical Stimulation Location  R low back    Electrical Stimulation Action  IFC    Electrical Stimulation Parameters  80-150 hz x15 min    Electrical Stimulation Goals  Pain                  PT Long Term Goals - 11/17/17 1239      PT LONG TERM GOAL #1   Title  Independent with a HEP.    Time  6    Period  Weeks    Status  Achieved      PT LONG TERM GOAL #2   Title  Restore full left ankle AROM.    Time  6    Period  Weeks    Status  Not Met      PT LONG TERM GOAL #3   Title  Left ankle strength to 4+/5.    Time  6    Period  Weeks    Status  Not Met       PT LONG TERM GOAL #4   Title  Walk without an assistive device and normal gait pattern.    Time  6    Period  Weeks    Status  Not Met Single point cane with gait deviations      PT LONG TERM GOAL #5   Title  Perform ADL's with pain not > 2-3/10.    Time  6    Period  Weeks    Status  Not Met            Plan - 11/17/17 1111    Clinical Impression Statement  Patient was able to tolerate treatment well. Patient reports he has been performing ankle HEP and was provided with low level core and glute exercises. Patient reported understanding. Normal response to modalities upon removal.  DC today.    Clinical Presentation  Unstable    Clinical Decision Making  Moderate    Rehab Potential  Fair    Clinical Impairments Affecting Rehab Potential  Uncertain rehab potential due to nerve damage from work injury.    PT Frequency  2x / week    PT Duration  6 weeks    PT Treatment/Interventions  ADLs/Self Care Home Management;Electrical Stimulation;Ultrasound;Functional mobility training;Therapeutic activities;Therapeutic exercise;Balance training;Neuromuscular re-education;Patient/family education;Passive range of motion;Manual techniques    PT Next Visit Plan  DC    Consulted and Agree with Plan of Care  Patient       Patient will benefit from skilled therapeutic intervention in order to improve the following deficits and impairments:  Abnormal gait, Decreased activity tolerance, Decreased strength, Pain  Visit Diagnosis: Muscle weakness (generalized)  Foot drop, left  Acute bilateral low back pain with left-sided sciatica     Problem List Patient Active Problem List   Diagnosis Date Noted  . Dyspnea on exertion 02/09/2017  . Diabetes mellitus without complication ([place]) 86/76/1950  . History of gastric bypass 02/07/2017  . OSA (obstructive sleep apnea) 02/07/2017  . Obesity (BMI 30-39.9)  02/07/2017  . Hypogonadism in male 02/07/2017    PHYSICAL THERAPY DISCHARGE  SUMMARY  Visits from Start of Care: 15  Current functional level related to goals / functional outcomes: See above   Remaining deficits: See goals.    Education / Equipment: HEP  Plan: Patient agrees to discharge.  Patient goals were not met. Patient is being discharged due to meeting the stated rehab goals.  ?????       Gabriela Eves, PT, DPT 11/17/2017, 12:43 PM  [facility] [address] [place], [name], [address] Phone: [phone]   Fax:  [phone]  Name: Aaron Shah MRN: [id number] Date of Birth: 1950/08/21

## 2017-11-20 DIAGNOSIS — Z6837 Body mass index (BMI) 37.0-37.9, adult: Secondary | ICD-10-CM | POA: Diagnosis not present

## 2017-11-20 DIAGNOSIS — M5126 Other intervertebral disc displacement, lumbar region: Secondary | ICD-10-CM | POA: Diagnosis not present

## 2017-11-20 DIAGNOSIS — I10 Essential (primary) hypertension: Secondary | ICD-10-CM | POA: Diagnosis not present

## 2017-11-29 ENCOUNTER — Encounter: Payer: Self-pay | Admitting: *Deleted

## 2017-11-29 ENCOUNTER — Ambulatory Visit (INDEPENDENT_AMBULATORY_CARE_PROVIDER_SITE_OTHER): Payer: Medicare HMO | Admitting: *Deleted

## 2017-11-29 VITALS — BP 149/86 | HR 67 | Ht 71.0 in | Wt 268.0 lb

## 2017-11-29 DIAGNOSIS — Z0000 Encounter for general adult medical examination without abnormal findings: Secondary | ICD-10-CM

## 2017-11-29 NOTE — Progress Notes (Signed)
Subjective:   Aaron Shah is a 67 y.o. male who presents for an Initial Medicare Annual Wellness Visit.  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  He had surgical repair of the herniated disc, but now suffers from left sided foot drop.  He wears a brace on the left leg to help with foot drop symptoms.  [name] lives with his wife and their dog in [place], [name].  He does not have children.  He enjoys going to church, fishing, camping, and traveling.  [name] feels his health this year is worse than last year due to his back injury and residual foot drop.  He went to the ER once this and had one surgery this year due to the back injury.  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  Scheduled patient for DOT physical Friday with Chevis Pretty, FNP.   Review of Systems    Cardiac Risk Factors include: advanced age (>45men, >19 women);diabetes mellitus;male gender    Objective:    Today's Vitals   11/29/17 1035  BP: (!) 149/86  Pulse: 67  Weight: 268 lb (121.6 kg)  Height: 5\' 11"  (1.803 m)   Body mass index is 37.38 kg/m.  Advanced Directives 11/29/2017 09/13/2017 07/31/2017 02/15/2017 08/19/2015  Does Patient Have a Medical Advance Directive? No No No No No  Does patient want to make changes to medical advance directive? Yes (MAU/Ambulatory/Procedural Areas - Information given) - - - -  Would patient like information on creating a medical advance directive? - - No - Patient declined No - Patient declined -    Current Medications (verified) Outpatient Encounter Medications as of 11/29/2017  Medication Sig  . colchicine 0.6 MG tablet Take 0.12 mg by mouth every evening.   . cyclobenzaprine (FLEXERIL) 10 MG tablet Take 1 tablet (10 mg  total) by mouth 2 (two) times daily as needed for muscle spasms.  . hydrocortisone (ANUSOL-HC) 25 MG suppository Place 1 suppository (25 mg total) rectally 2 (two) times daily.  Marland Kitchen lisinopril (PRINIVIL,ZESTRIL) 10 MG tablet Take 5 mg by mouth daily.   . metFORMIN (GLUCOPHAGE) 500 MG tablet Take 250 mg by mouth 2 (two) times daily with a meal.  . Multiple Vitamin (MULTIVITAMIN WITH MINERALS) TABS tablet Take 1 tablet by mouth daily.  Marland Kitchen omeprazole (PRILOSEC) 20 MG capsule Take 40 mg by mouth daily.  Marland Kitchen pyridOXINE (VITAMIN B-6) 100 MG tablet Take 100 mg by mouth daily.  Marland Kitchen testosterone cypionate (DEPOTESTOSTERONE CYPIONATE) 200 MG/ML injection Inject 200 mg into the muscle every 14 (fourteen) days. 8ml. q 2 weeks   . vitamin B-12 (CYANOCOBALAMIN) 1000 MCG tablet Take 1,000 mcg by mouth daily.  Marland Kitchen albuterol (PROVENTIL HFA;VENTOLIN HFA) 108 (90 Base) MCG/ACT inhaler Inhale 1 puff into the lungs every 6 (six) hours as needed for wheezing or shortness of breath.   Marland Kitchen ibuprofen (ADVIL,MOTRIN) 400 MG tablet Take 400 mg by mouth every 6 (six) hours as needed for mild pain or moderate pain.    No facility-administered encounter medications on file as of 11/29/2017.     Allergies (verified) Patient has no known allergies.   History: Past Medical History:  Diagnosis Date  . Arthritis    entire  spine, C4,5,6- HNP, seeing [facility]   . Asthma    h/o- uses Proventil when he has a flare   . Cataract   . Concussion 05/2013   resulted from a fall, had been rx for depression after that but developed feelings of homicide & suicide, med. was d/c'd  . Diabetes mellitus without complication ([place]) 4801   oral med.   . Diabetes mellitus without complication ([place])   . H/O exercise stress test 2016   pt,. reports that it was done thru the [facility] in [place]  . Hypertension   . Pneumonia 1970's   [place], pt. reports that he is prone to getting bronchitis   . Sleep apnea 2016   CPAP machine but  not using at present, test done in [place] , will have a new study here in [place]    Past Surgical History:  Procedure Laterality Date  . CATARACT EXTRACTION W/PHACO Left 08/19/2015   Procedure: CATARACT EXTRACTION PHACO AND INTRAOCULAR LENS PLACEMENT (IOC) LEFT EYE;  Surgeon: Marylynn Pearson, MD;  Location: [facility];  Service: Ophthalmology;  Laterality: Left;  . EYE SURGERY     glass removed fr. R eye   . 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 prior to   . HERNIA REPAIR     inguinal- L   . IRRIGATION AND DEBRIDEMENT SEBACEOUS CYST Right    arm- upper, cysts both removed  . LEFT HEART CATH AND CORONARY ANGIOGRAPHY N/A 02/15/2017   Procedure: LEFT HEART CATH AND CORONARY ANGIOGRAPHY;  Surgeon: Leonie Man, MD;  Location: [facility];  Service: Cardiovascular;  Laterality: N/A;  . TESTICLE REMOVAL  1960's, 2001   both removed   . TONSILLECTOMY     Family History  Problem Relation Age of Onset  . Cancer Mother        Lung  . Cancer Father        Kidney, Lung  . Emphysema Father   . COPD Father   . Cancer Sister        Skin   . Cancer Paternal Grandfather        Intestinal   Social History   Socioeconomic History  . Marital status: Married    Spouse name: Not on file  . Number of children: Not on file  . Years of education: Not on file  . Highest education level: Not on file  Occupational History  . Not on file  Social Needs  . Financial resource strain: Not on file  . Food insecurity:    Worry: Not on file    Inability: Not on file  . Transportation needs:    Medical: Not on file    Non-medical: Not on file  Tobacco Use  . Smoking status: Never Smoker  . Smokeless tobacco: Never Used  Substance and Sexual Activity  . Alcohol use: Yes    Comment: rare use of beer  . Drug use: No  . Sexual activity: Not on file  Lifestyle  . Physical activity:    Days per week: Not on file    Minutes per session: Not on file  .  Stress: Not on file  Relationships  . Social connections:    Talks on phone: Not on file    Gets together: Not on file    Attends religious service: Not on file    Active member of club or organization: Not on file  Attends meetings of clubs or organizations: Not on file    Relationship status: Not on file  Other Topics Concern  . Not on file  Social History Narrative   ** Merged History Encounter **       Tobacco Counseling Counseling given: No                         Activities of Daily Living In your present state of health, do you have any difficulty performing the following activities: 11/29/2017 02/07/2017  Hearing? Y N  Comment bilateral hearing aids -  Vision? N N  Difficulty concentrating or making decisions? N N  Walking or climbing stairs? Y N  Comment foot drop -  Dressing or bathing? N N  Doing errands, shopping? N N  Preparing Food and eating ? N -  Using the Toilet? N -  In the past six months, have you accidently leaked urine? Y -  Comment has lost bowel control 4-5 times since back surgery, control is starting to improve -  Do you have problems with loss of bowel control? Y -  Comment has lost bowel control 4-5 times since back surgery, control is starting to improve  -  Managing your Medications? N -  Managing your Finances? N -  Housekeeping or managing your Housekeeping? Y -  Comment Wife helps with chores -  Some recent data might be hidden     Immunizations and Health Maintenance  There is no immunization history on file for this patient. Health Maintenance Due  Topic Date Due  . Hepatitis C Screening  [birth year]  . FOOT EXAM  12/07/1962  . OPHTHALMOLOGY EXAM  12/07/1962  . HIV Screening  12/07/1967  . TETANUS/TDAP  12/07/1971  . COLONOSCOPY  12/07/2002   Patient states he gets all preventative care at the [facility] office in [place], [name].  He states he has had a recent eye exam.  He is scheduled for a colonoscopy in 02/2018-  asked him to please have them send [facility] a copy of the results.  Recommend foot exam by Dr. Evette Doffing at next visit.  Recommend Hep C and HIV screening at the [facility] at next visit there.  Patient Care Team: Eustaquio Maize, MD as PCP - General (Pediatrics) Delorse Limber (Family Medicine)      Assessment:   This is a routine wellness examination for Deangelo.  Hearing/Vision screen Patient wears bilateral hearing aids which were provided to him by the [facility].  He has had cataract removal.  Wears glasses, but says he sees ok even without them.   Dietary issues and exercise activities discussed: Current Exercise Habits: The patient does not participate in regular exercise at present, Exercise limited by: neurologic condition(s)   Mr. Downard states he usually eats 4-5 small meals per day.  He had gastric bypass surgery in 2006.  He has to be careful not to overeat and avoid things that upset his stomach.  He states he and his wife try to avoid eating bread, and eat lean protein- baked, broiled, grilled sauteed.  Redommended diet of mostly lean proteins, vegetables, and fruits and whole grains in moderation.  Goals    . Exercise 3x per week (30 min per time)     Increase left leg strength- patient states he wants to do strength training and walking on the treadmill as tolerated.       Depression Screen PHQ 2/9 Scores 11/29/2017 11/14/2017 10/11/2017 08/01/2017  PHQ - 2 Score 2 0 2 0  PHQ- 9 Score 3 - 6 -    Fall Risk Fall Risk  11/29/2017 11/14/2017 10/11/2017 08/14/2017 08/01/2017  Falls in the past year? Yes No Yes Yes Yes  Number falls in past yr: 2 or more - 2 or more 2 or more 2 or more  Injury with Fall? Yes - Yes Yes Yes  Risk Factor Category  High Fall Risk - High Fall Risk - -  Risk for fall due to : History of fall(s);Impaired balance/gait - - - -  Follow up Education provided;Falls prevention discussed - - - -    Is the patient's home free of loose throw rugs in walkways, pet beds,  electrical cords, etc?   yes      Grab bars in the bathroom? yes      Handrails on the stairs?   no has 2-3 stairs at the entrance of home, does not have handrail      Adequate lighting?   yes    Cognitive Function: MMSE - Mini Mental State Exam 11/29/2017  Orientation to time 5  Orientation to Place 5  Registration 3  Attention/ Calculation 5  Recall 2  Language- name 2 objects 2  Language- repeat 1  Language- follow 3 step command 3  Language- read & follow direction 1  Write a sentence 1  Copy design 1  Total score 29        Screening Tests Health Maintenance  Topic Date Due  . Hepatitis C Screening  28-May-1950  . FOOT EXAM  12/07/1962  . OPHTHALMOLOGY EXAM  12/07/1962  . HIV Screening  12/07/1967  . TETANUS/TDAP  12/07/1971  . COLONOSCOPY  12/07/2002  . INFLUENZA VACCINE  04/03/2018 (Originally 12/07/2017)  . HEMOGLOBIN A1C  04/12/2018   Patient states he gets all preventative care at the [facility] office in [place], [name].  He states he has had a recent eye exam.  He is scheduled for a colonoscopy in 02/2018- asked him to please have them send [facility] a copy of the results.  Recommend foot exam by Dr. Evette Doffing at next visit.  Recommend Hep C and HIV screening at the [facility] at next visit there.  Patient Care Team: Dr. Assunta Found   Qualifies for Shingles Vaccine? Yes, Declined today  Cancer Screenings: Lung: Low Dose CT Chest recommended if Age 60-80 years, 30 pack-year currently smoking OR have quit w/in 15years. Patient does not qualify. Colorectal: Scheduled for 02/2018  Additional Screenings:  Hepatitis C Screening: Recommend screening at next visit at [facility].      Plan:     [facility] and keep colonoscopy appointment 02/2018.  Ask the [facility] to send [place] your most recent eye exam to our office.   Work on your goal of increasing your exercise to 2-3 times per week for 30 minutes.    Review the information given on Advance Directives,  and if you complete these please bring a copy to our office to be filed in your chart.   I have personally reviewed and noted the following in the patient's chart:   . Medical and social history . Use of alcohol, tobacco or illicit drugs  . Current medications and supplements . Functional ability and status . Nutritional status . Physical activity . Advanced directives . List of other physicians . Hospitalizations, surgeries, and ER visits in previous 12 months . Vitals . Screenings to include cognitive, depression, and  falls . Referrals and appointments  In addition, I have reviewed and discussed with patient certain preventive protocols, quality metrics, and best practice recommendations. A written personalized care plan for preventive services as well as general preventive health recommendations were provided to patient.     Nashton Belson M, RN   11/29/2017    I have reviewed and agree with the above AWV documentation.   Assunta Found, MD [facility] 11/30/2017, 1:57 PM

## 2017-11-29 NOTE — Patient Instructions (Addendum)
Please [facility] with [facility] on getting tetanus (tdap) shot and colonoscopy.  Please ask the [facility] to send [place] you most recent eye exam.   Please work on your goal of increasing your exercise to 2-3 times per week for 30 minutes.    Please review the information given on Advance Directives, and if you complete these please bring a copy to our office to be filed in your chart.   Thank you for coming in for you Annual Wellness Visit today!    Preventive Care 40-64 Years, Male Preventive care refers to lifestyle choices and visits with your health care provider that can promote health and wellness. What does preventive care include?  A yearly physical exam. This is also called an annual well check.  Dental exams once or twice a year.  Routine eye exams. Ask your health care provider how often you should have your eyes checked.  Personal lifestyle choices, including: ? Daily care of your teeth and gums. ? Regular physical activity. ? Eating a healthy diet. ? Avoiding tobacco and drug use. ? Limiting alcohol use. ? Practicing safe sex. ? Taking low-dose aspirin every day starting at age 55. What happens during an annual well check? The services and screenings done by your health care provider during your annual well check will depend on your age, overall health, lifestyle risk factors, and family history of disease. Counseling Your health care provider may ask you questions about your:  Alcohol use.  Tobacco use.  Drug use.  Emotional well-being.  Home and relationship well-being.  Sexual activity.  Eating habits.  Work and work Statistician.  Screening You may have the following tests or measurements:  Height, weight, and BMI.  Blood pressure.  Lipid and cholesterol levels. These may be checked every 5 years, or more frequently if you are over 27 years old.  Skin check.  Lung cancer screening. You may have this screening every year starting at age 41 if you have a  30-pack-year history of smoking and currently smoke or have quit within the past 15 years.  Fecal occult blood test (FOBT) of the stool. You may have this test every year starting at age 33.  Flexible sigmoidoscopy or colonoscopy. You may have a sigmoidoscopy every 5 years or a colonoscopy every 10 years starting at age 33.  Prostate cancer screening. Recommendations will vary depending on your family history and other risks.  Hepatitis C blood test.  Hepatitis B blood test.  Sexually transmitted disease (STD) testing.  Diabetes screening. This is done by checking your blood sugar (glucose) after you have not eaten for a while (fasting). You may have this done every 1-3 years.  Discuss your test results, treatment options, and if necessary, the need for more tests with your health care provider. Vaccines Your health care provider may recommend certain vaccines, such as:  Influenza vaccine. This is recommended every year.  Tetanus, diphtheria, and acellular pertussis (Tdap, Td) vaccine. You may need a Td booster every 10 years.  Varicella vaccine. You may need this if you have not been vaccinated.  Zoster vaccine. You may need this after age 94.  Measles, mumps, and rubella (MMR) vaccine. You may need at least one dose of MMR if you were born in 1957 or later. You may also need a second dose.  Pneumococcal 13-valent conjugate (PCV13) vaccine. You may need this if you have certain conditions and have not been vaccinated.  Pneumococcal polysaccharide (PPSV23) vaccine. You may need one or  two doses if you smoke cigarettes or if you have certain conditions.  Meningococcal vaccine. You may need this if you have certain conditions.  Hepatitis A vaccine. You may need this if you have certain conditions or if you travel or work in places where you may be exposed to hepatitis A.  Hepatitis B vaccine. You may need this if you have certain conditions or if you travel or work in places where  you may be exposed to hepatitis B.  Haemophilus influenzae type b (Hib) vaccine. You may need this if you have certain risk factors.  Talk to your health care provider about which screenings and vaccines you need and how often you need them. This information is not intended to replace advice given to you by your health care provider. Make sure you discuss any questions you have with your health care provider. Document Released: 05/22/2015 Document Revised: 01/13/2016 Document Reviewed: 02/24/2015 Elsevier Interactive Patient Education  2018 [place] Prevention in the Home Falls can cause injuries and can affect people from all age groups. There are many simple things that you can do to make your home safe and to help prevent falls. What can I do on the outside of my home?  Regularly repair the edges of walkways and driveways and fix any cracks.  Remove high doorway thresholds.  Trim any shrubbery on the main path into your home.  Use bright outdoor lighting.  Clear walkways of debris and clutter, including tools and rocks.  Regularly check that handrails are securely fastened and in good repair. Both sides of any steps should have handrails.  Install guardrails along the edges of any raised decks or porches.  Have leaves, snow, and ice cleared regularly.  Use sand or salt on walkways during winter months.  In the garage, clean up any spills right away, including grease or oil spills. What can I do in the bathroom?  Use night lights.  Install grab bars by the toilet and in the tub and shower. Do not use towel bars as grab bars.  Use non-skid mats or decals on the floor of the tub or shower.  If you need to sit down while you are in the shower, use a plastic, non-slip stool.  Keep the floor dry. Immediately clean up any water that spills on the floor.  Remove soap buildup in the tub or shower on a regular basis.  Attach bath mats securely with double-sided  non-slip rug tape.  Remove throw rugs and other tripping hazards from the floor. What can I do in the bedroom?  Use night lights.  Make sure that a bedside light is easy to reach.  Do not use oversized bedding that drapes onto the floor.  Have a firm chair that has side arms to use for getting dressed.  Remove throw rugs and other tripping hazards from the floor. What can I do in the kitchen?  Clean up any spills right away.  Avoid walking on wet floors.  Place frequently used items in easy-to-reach places.  If you need to reach for something above you, use a sturdy step stool that has a grab bar.  Keep electrical cables out of the way.  Do not use floor polish or wax that makes floors slippery. If you have to use wax, make sure that it is non-skid floor wax.  Remove throw rugs and other tripping hazards from the floor. What can I do in the stairways?  Do not leave any  items on the stairs.  Make sure that there are handrails on both sides of the stairs. Fix handrails that are broken or loose. Make sure that handrails are as long as the stairways.  Check any carpeting to make sure that it is firmly attached to the stairs. Fix any carpet that is loose or worn.  Avoid having throw rugs at the top or bottom of stairways, or secure the rugs with carpet tape to prevent them from moving.  Make sure that you have a light switch at the top of the stairs and the bottom of the stairs. If you do not have them, have them installed. What are some other fall prevention tips?  Wear closed-toe shoes that fit well and support your feet. Wear shoes that have rubber soles or low heels.  When you use a stepladder, make sure that it is completely opened and that the sides are firmly locked. Have someone hold the ladder while you are using it. Do not climb a closed stepladder.  Add color or contrast paint or tape to grab bars and handrails in your home. Place contrasting color strips on the  first and last steps.  Use mobility aids as needed, such as canes, walkers, scooters, and crutches.  Turn on lights if it is dark. Replace any light bulbs that burn out.  Set up furniture so that there are clear paths. Keep the furniture in the same spot.  Fix any uneven floor surfaces.  Choose a carpet design that does not hide the edge of steps of a stairway.  Be aware of any and all pets.  Review your medicines with your healthcare provider. Some medicines can cause dizziness or changes in blood pressure, which increase your risk of falling. Talk with your health care provider about other ways that you can decrease your risk of falls. This may include working with a physical therapist or trainer to improve your strength, balance, and endurance. This information is not intended to replace advice given to you by your health care provider. Make sure you discuss any questions you have with your health care provider. Document Released: 04/15/2002 Document Revised: 09/22/2015 Document Reviewed: 05/30/2014 Elsevier Interactive Patient Education  [employer].

## 2017-12-01 ENCOUNTER — Encounter: Payer: Self-pay | Admitting: Nurse Practitioner

## 2017-12-01 ENCOUNTER — Ambulatory Visit (INDEPENDENT_AMBULATORY_CARE_PROVIDER_SITE_OTHER): Payer: Self-pay | Admitting: Nurse Practitioner

## 2017-12-01 DIAGNOSIS — Z024 Encounter for examination for driving license: Secondary | ICD-10-CM

## 2017-12-01 NOTE — Progress Notes (Signed)
Patient ID: Aaron Shah, male   DOB: October 14, 1950, 67 y.o.   MRN: [id number]  private DOT- see scanned in assessment

## 2018-03-12 ENCOUNTER — Ambulatory Visit: Payer: Self-pay | Admitting: Pediatrics

## 2018-03-15 ENCOUNTER — Encounter: Payer: Self-pay | Admitting: Internal Medicine

## 2018-03-15 ENCOUNTER — Encounter: Payer: Self-pay | Admitting: Pediatrics

## 2018-03-26 ENCOUNTER — Ambulatory Visit (INDEPENDENT_AMBULATORY_CARE_PROVIDER_SITE_OTHER): Payer: Medicare HMO | Admitting: Pediatrics

## 2018-03-26 ENCOUNTER — Encounter: Payer: Self-pay | Admitting: Pediatrics

## 2018-03-26 VITALS — BP 130/69 | HR 62 | Temp 98.1°F | Ht 71.0 in | Wt 288.0 lb

## 2018-03-26 DIAGNOSIS — I10 Essential (primary) hypertension: Secondary | ICD-10-CM

## 2018-03-26 DIAGNOSIS — E119 Type 2 diabetes mellitus without complications: Secondary | ICD-10-CM

## 2018-03-26 DIAGNOSIS — R399 Unspecified symptoms and signs involving the genitourinary system: Secondary | ICD-10-CM | POA: Diagnosis not present

## 2018-03-26 LAB — BMP8+EGFR
BUN/Creatinine Ratio: 13 (ref 10–24)
BUN: 16 mg/dL (ref 8–27)
CALCIUM: 8.7 mg/dL (ref 8.6–10.2)
CHLORIDE: 106 mmol/L (ref 96–106)
CO2: 20 mmol/L (ref 20–29)
Creatinine, Ser: 1.25 mg/dL (ref 0.76–1.27)
GFR calc Af Amer: 69 mL/min/{1.73_m2} (ref >59)
GFR calc non Af Amer: 60 mL/min/{1.73_m2} (ref >59)
Glucose: 136 mg/dL — ABNORMAL HIGH (ref 65–99)
POTASSIUM: 4.6 mmol/L (ref 3.5–5.2)
Sodium: 142 mmol/L (ref 134–144)

## 2018-03-26 LAB — BAYER DCA HB A1C WAIVED: HB A1C: 7.4 % — AB (ref <7.0)

## 2018-03-26 MED ORDER — SITAGLIPTIN PHOSPHATE 100 MG PO TABS: 100.0000 mg | ORAL_TABLET | Freq: Every day | ORAL | 1 refills | Status: DC

## 2018-03-26 NOTE — Progress Notes (Signed)
  Subjective:   Patient ID: Aaron Shah, male    DOB: 12-29-1950, 67 y.o.   MRN: [id number] CC: Medical Management of Chronic Issues (diabetic)  HPI: Aaron Shah is a 67 y.o. male   Diabetes:  Taking medicines regularly.  Has had weight gain over the last few months, he says he has been eating differently than usual.  Not as active as he was before.  Activities limited because of foot drop now.  Foot drop: Started after back surgery.  Continues to bother him.  Was given new type of brace, has been helping better.  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  Taking testosterone, prescribed by the [place].  Colonoscopy: Due, upcoming through the [place].  Urinary symptoms: Often has to sit, change position, stand, push on his bladder to help with emptying his bladder.  Relevant past medical, surgical, family and social history reviewed. Allergies and medications reviewed and updated. Social History   Tobacco Use  Smoking Status Never Smoker  Smokeless Tobacco Never Used   ROS: Per HPI   Objective:    BP 130/69   Pulse 62   Temp 98.1 F (36.7 C) (Oral)   Ht '5\' 11"'$  (1.803 m)   Wt 288 lb (130.6 kg)   BMI 40.17 kg/m   Wt Readings from Last 3 Encounters:  03/26/18 288 lb (130.6 kg)  11/29/17 268 lb (121.6 kg)  11/16/17 268 lb (121.6 kg)    Gen: NAD, alert, cooperative with exam, NCAT EYES: EOMI, no conjunctival injection, or no icterus ENT:  TMs pearly gray b/l, OP without erythema LYMPH: no cervical LAD CV: NRRR, normal S1/S2, no murmur, distal pulses 2+ b/l Resp: CTABL, no wheezes, normal WOB Abd: +BS, soft, NTND. no guarding or organomegaly Ext: No edema, warm Neuro: Alert and oriented MSK: normal muscle bulk  Assessment & Plan:  Aaron Shah was seen today for medical management of chronic issues.  Diagnoses and all orders for this visit:  Diabetes mellitus without complication  (HCC) W0J slightly up, 7.4 from 6.7.  Will add Januvia.  Let me know if expensive.  Referral to nutrition.  Continue avoiding sugary foods. -     Bayer DCA Hb A1c Waived -     BMP8+EGFR -     Microalbumin / creatinine urine ratio -     Amb ref to Medical Nutrition Therapy-MNT -     sitaGLIPtin (JANUVIA) 100 MG tablet; Take 1 tablet (100 mg total) by mouth daily.  Essential hypertension Stable, continue current medicines  Lower urinary tract symptoms Ongoing symptoms of urinary retention, will refer to urology. -     Ambulatory referral to Urology -     PSA, total and free   Follow up plan: Return in about 3 months (around 06/26/2018). Assunta Found, MD [place]

## 2018-03-27 LAB — MICROALBUMIN / CREATININE URINE RATIO
Creatinine, Urine: 109 mg/dL
MICROALB/CREAT RATIO: 9.3 mg/g{creat} (ref 0.0–30.0)
Microalbumin, Urine: 10.1 ug/mL

## 2018-03-28 LAB — SPECIMEN STATUS REPORT

## 2018-03-28 LAB — PSA, TOTAL AND FREE
PROSTATE SPECIFIC AG, SERUM: 0.6 ng/mL (ref 0.0–4.0)
PSA FREE: 0.17 ng/mL
PSA, Free Pct: 28.3 %

## 2018-04-16 ENCOUNTER — Ambulatory Visit: Payer: Self-pay | Admitting: Skilled Nursing Facility1

## 2018-06-06 ENCOUNTER — Ambulatory Visit (INDEPENDENT_AMBULATORY_CARE_PROVIDER_SITE_OTHER): Payer: Medicare Other | Admitting: Urology

## 2018-06-06 DIAGNOSIS — R3912 Poor urinary stream: Secondary | ICD-10-CM

## 2018-06-06 DIAGNOSIS — N5201 Erectile dysfunction due to arterial insufficiency: Secondary | ICD-10-CM | POA: Diagnosis not present

## 2018-06-11 ENCOUNTER — Telehealth: Payer: Self-pay | Admitting: Internal Medicine

## 2018-06-11 ENCOUNTER — Other Ambulatory Visit: Payer: Self-pay

## 2018-06-11 ENCOUNTER — Encounter: Payer: Self-pay | Admitting: Nurse Practitioner

## 2018-06-11 ENCOUNTER — Ambulatory Visit (INDEPENDENT_AMBULATORY_CARE_PROVIDER_SITE_OTHER): Payer: No Typology Code available for payment source | Admitting: Nurse Practitioner

## 2018-06-11 VITALS — BP 138/69 | HR 55 | Temp 97.4°F | Ht 71.0 in | Wt 285.0 lb

## 2018-06-11 DIAGNOSIS — Z860100 Personal history of colon polyps, unspecified: Secondary | ICD-10-CM

## 2018-06-11 DIAGNOSIS — K649 Unspecified hemorrhoids: Secondary | ICD-10-CM

## 2018-06-11 DIAGNOSIS — K5900 Constipation, unspecified: Secondary | ICD-10-CM | POA: Insufficient documentation

## 2018-06-11 NOTE — Assessment & Plan Note (Addendum)
The patient has a history of known hemorrhoids with intermittent rectal bleeding when he is more constipated.  He is wanting hemorrhoid treatment.  He currently uses suppositories and creams.  We can evaluate his hemorrhoids during his colonoscopy to try to discern whether they can be banded or not.  He would likely need to follow-up with the [facility] for hemorrhoid banding/surgery versus referral back to our office for banding if appropriate.  Follow-up based on post procedure recommendations.

## 2018-06-11 NOTE — Progress Notes (Signed)
Primary Care Physician:  System, Pcp Not In Primary Gastroenterologist:  Dr. Gala Romney  Chief Complaint  Patient presents with  . Consult    TCS/EDG. last had TCS done 5 yrs ago.   . Rectal Bleeding    d/t hemorrhoids    HPI:   Aaron Shah is a 68 y.o. male who presents on referral from the [facility] to schedule colonoscopy.  Reviewed information provided with the referral including notation about personal history of colon polyps per the patient although records were not obtained by the [facility].  No history of colonoscopy found in our system.  Today he states he is doing well overall.  Noted personal history of colon polyps with intermittent rectal bleeding due to known hemorrhoids.  He is wanting to treat his hemorrhoids. He states his last colonoscopy was about 6 years ago in [place] and recommended 5 year repeat. Unsure of family history r/t CRC. Has known hemorrhoids and notes constipation after back surgery, a lot of flatulence. Has intermittent hematochezia if he has several bowel movements, occasionally uses a pad afterward. Uses OTC hemorrhoid cream as well. [facility] also [facility] supp[facility] with a steroid in it. Intermittent abdominal pain after gastric bypass in 2006. Pain is lower abdominal pain, crampy, improves after a bowel movement. Denies frequent straining and hard stools; has more intermittent loose stools. Denies melena, fever, chills, unintentional weight loss. Was previously told to take PPI indefinitely due to gastric bypass. Denies chest pain, dyspnea, dizziness, lightheadedness, syncope, near syncope. Denies any other upper or lower GI symptoms.  Ibuprofen intermittently for stiffness. Denies ASA powders. Has sleep apnea and uses CPAP.  Past Medical History:  Diagnosis Date  . Arthritis    entire spine, C4,5,6- HNP, seeing MD at [facility]   . Asthma    h/o- uses Proventil when he has a flare   . Cataract   . Concussion 05/2013   resulted from a  fall, had been rx for depression after that but developed feelings of homicide & suicide, med. was d/c'd  . Diabetes mellitus without complication ([place]) 3532   oral med.   . Diabetes mellitus without complication ([place])   . H/O exercise stress test 2016   pt,. reports that it was done thru the [facility] [place] in [place]  . Hypertension   . Pneumonia 1970's   [place]osp. [place], pt. reports that he is prone to getting bronchitis   . Sleep apnea 2016   CPAP machine but not using at present, test done in [place] , will have a new study here in [place]     Past Surgical History:  Procedure Laterality Date  . CATARACT EXTRACTION W/PHACO Left 08/19/2015   Procedure: CATARACT EXTRACTION PHACO AND INTRAOCULAR LENS PLACEMENT (IOC) LEFT EYE;  Surgeon: Marylynn Pearson, MD;  Location: [facility];  Service: Ophthalmology;  Laterality: Left;  . EYE SURGERY     glass removed fr. R eye   . GASTRIC BYPASS  12/2004   [place], [place], weight was 400lbs. prior to   . HERNIA REPAIR     inguinal- L   . IRRIGATION AND DEBRIDEMENT SEBACEOUS CYST Right    arm- upper, cysts both removed  . LEFT HEART CATH AND CORONARY ANGIOGRAPHY N/A 02/15/2017   Procedure: LEFT HEART CATH AND CORONARY ANGIOGRAPHY;  Surgeon: Leonie Man, MD;  Location: [facility];  Service: Cardiovascular;  Laterality: N/A;  . TESTICLE REMOVAL  1960's, 2001   both removed   .  TONSILLECTOMY      Current Outpatient Medications  Medication Sig Dispense Refill  . albuterol (PROVENTIL HFA;VENTOLIN HFA) 108 (90 Base) MCG/ACT inhaler Inhale 1 puff into the lungs every 6 (six) hours as needed for wheezing or shortness of breath.     Marland Kitchen atorvastatin (LIPITOR) 20 MG tablet Take 20 mg by mouth daily.    Marland Kitchen GABAPENTIN PO Take 250 mg by mouth daily.    . hydrocortisone (ANUSOL-HC) 25 MG suppository Place 1 suppository (25 mg total) rectally 2 (two) times daily. (Patient taking differently: Place 25 mg rectally as needed. ) 12 suppository  0  . ibuprofen (ADVIL,MOTRIN) 400 MG tablet Take 400 mg by mouth every 6 (six) hours as needed for mild pain or moderate pain.     Marland Kitchen lisinopril (PRINIVIL,ZESTRIL) 10 MG tablet Take 5 mg by mouth daily.     . metFORMIN (GLUCOPHAGE) 500 MG tablet Take 500 mg by mouth daily with breakfast.     . Multiple Vitamin (MULTIVITAMIN WITH MINERALS) TABS tablet Take 1 tablet by mouth daily.    Marland Kitchen omeprazole (PRILOSEC) 20 MG capsule Take 40 mg by mouth daily.    Marland Kitchen pyridOXINE (VITAMIN B-6) 100 MG tablet Take 100 mg by mouth daily.    Marland Kitchen testosterone cypionate (DEPOTESTOSTERONE CYPIONATE) 200 MG/ML injection Inject 200 mg into the muscle every 14 (fourteen) days. 16ml. q 2 weeks     . vitamin B-12 (CYANOCOBALAMIN) 1000 MCG tablet Take 1,000 mcg by mouth daily.     No current facility-administered medications for this visit.     Allergies as of 06/11/2018  . (No Known Allergies)    Family History  Problem Relation Age of Onset  . Cancer Mother        Lung  . Cancer Father        Kidney, Lung  . Emphysema Father   . COPD Father   . Cancer Sister        Skin   . Cancer Paternal Grandfather        Intestinal    Social History   Socioeconomic History  . Marital status: Married    Spouse name: Not on file  . Number of children: Not on file  . Years of education: Not on file  . Highest education level: Not on file  Occupational History  . Not on file  Social Needs  . Financial resource strain: Not on file  . Food insecurity:    Worry: Not on file    Inability: Not on file  . Transportation needs:    Medical: Not on file    Non-medical: Not on file  Tobacco Use  . Smoking status: Never Smoker  . Smokeless tobacco: Never Used  Substance and Sexual Activity  . Alcohol use: Yes    Comment: rare use of beer  . Drug use: No  . Sexual activity: Not on file  Lifestyle  . Physical activity:    Days per week: Not on file    Minutes per session: Not on file  . Stress: Not on file    Relationships  . Social connections:    Talks on phone: Not on file    Gets together: Not on file    Attends religious service: Not on file    Active member of club or organization: Not on file    Attends meetings of clubs or organizations: Not on file    Relationship status: Not on file  . Intimate partner violence:  Fear of current or ex partner: Not on file    Emotionally abused: Not on file    Physically abused: Not on file    Forced sexual activity: Not on file  Other Topics Concern  . Not on file  Social History Narrative   ** Merged History Encounter **        Review of Systems: General: Negative for anorexia, weight loss, fever, chills, fatigue, weakness. ENT: Negative for hoarseness, difficulty swallowing. CV: Negative for chest pain, angina, palpitations, peripheral edema.  Respiratory: Negative for dyspnea at rest, cough, sputum, wheezing.  GI: See history of present illness. MS: History of L5 disc and subsequent surgery.  Derm: Negative for rash or itching.  Endo: Negative for unusual weight change.  Heme: Negative for bruising or bleeding. Allergy: Negative for rash or hives.    Physical Exam: BP 138/69   Pulse (!) 55   Temp (!) 97.4 F (36.3 C) (Oral)   Ht 5\' 11"  (1.803 m)   Wt 285 lb (129.3 kg)   BMI 39.75 kg/m  General:   Alert and oriented. Pleasant and cooperative. Well-nourished and well-developed.  Head:  Normocephalic and atraumatic. Eyes:  Without icterus, sclera clear and conjunctiva pink.  Ears:  Normal auditory acuity. Cardiovascular:  S1, S2 present without murmurs appreciated. Extremities without clubbing or edema. Respiratory:  Clear to auscultation bilaterally. No wheezes, rales, or rhonchi. No distress.  Gastrointestinal:  +BS, soft, non-tender and non-distended. No HSM noted. No guarding or rebound. No masses appreciated.  Rectal:  Deferred  Musculoskalatal:  Symmetrical without gross deformities. Neurologic:  Alert and oriented  x4;  grossly normal neurologically. Psych:  Alert and cooperative. Normal mood and affect. Heme/Lymph/Immune: No excessive bruising noted.    06/11/2018 8:55 AM   Disclaimer: This note was dictated with voice recognition software. Similar sounding words can inadvertently be transcribed and may not be corrected upon review.

## 2018-06-11 NOTE — Assessment & Plan Note (Signed)
Notes history of constipation with less frequent bowel movements after back surgery a couple years ago.  His stools are not overtly hard, but they are small.  Has some intermittent flatulence and diarrhea which seems to be consistent with overflow diarrhea in the setting of constipation.  At this point will recommend over-the-counter stool softener to see if this helps.  He can follow-up with the [facility] for further recommendations.

## 2018-06-11 NOTE — Patient Instructions (Addendum)
Your health issues we discussed today were:   Need for colonoscopy: 1. We will schedule your colonoscopy for you. 2. Please bring your CPAP device settings with you to your colonoscopy in case she needs CPAP and postop 3. Further recommendations will be made after your colonoscopy  Hemorrhoids: 1. We can evaluate your hemorrhoids during her colonoscopy to see if they can be banded versus would require surgery. 2. Further recommendations to follow. 3. You would likely need to follow-up with the [facility] for this unless they decide to refer you back to our office for treatment, if appropriate.  Constipation: 1. You likely have mild constipation which can be worsening your hemorrhoids when you have a flare. 2. Try taking over-the-counter Colace once daily to see if this helps you have better, more consistent bowel movements. 3. Follow-up with the [facility].  Overall I recommend:  1. Return for follow-up based on recommendations made after your colonoscopy. 2. Call us if you have any questions or concerns.  At [facility] we value your feedback. You may receive a survey about your visit today. Please share your experience as we strive to create trusting relationships with our patients to provide genuine, compassionate, quality care.  We appreciate your understanding and patience as we review any laboratory studies, imaging, and other diagnostic tests that are ordered as we care for you. Our office policy is 5 business days for review of these results, and any emergent or urgent results are addressed in a timely manner for your best interest. If you do not hear from our office in 1 week, please contact us.   We also encourage the use of MyChart, which contains your medical information for your review as well. If you are not enrolled in this feature, an access code is on this after visit summary for your convenience. Thank you for allowing [place] to be involved in your care.  It was great to see  you today!  I hope you have a great day!!

## 2018-06-11 NOTE — Assessment & Plan Note (Signed)
Per the patient has a personal history of colon polyps.  Last colonoscopy 6 years ago which found a couple polyps and a recommended 5-year repeat exam.  This was completed in [place], [place].  He has been referred by the [facility] for community care colonoscopy.  Besides constipation and hemorrhoids, no overt GI symptoms.  We will proceed with colonoscopy as recommended.  Proceed with TCS with Dr. Gala Romney in near future: the risks, benefits, and alternatives have been discussed with the patient in detail. The patient states understanding and desires to proceed.  The patient is not on any anticoagulants, anxiolytics, chronic pain medications, or antidepressants.  Conscious sedation should be adequate for his procedure.

## 2018-06-11 NOTE — Telephone Encounter (Signed)
Called pt, he has Moviprep at home that he received from the [facility] for his TCS. Instructions for Moviprep mailed.

## 2018-06-11 NOTE — Telephone Encounter (Signed)
[phone]  PATIENT CALLED WITH THE PREP INFO FROM THE [place]

## 2018-06-18 ENCOUNTER — Telehealth: Payer: Self-pay | Admitting: Internal Medicine

## 2018-06-18 NOTE — Telephone Encounter (Signed)
Pt called to say that he is scheduled procedure with [facility] on 4/29 and was letting us know that he was a diabetic. [phone]

## 2018-06-18 NOTE — Telephone Encounter (Signed)
Called patient and made aware adjustments are on his instructions for his medications. He voiced understanding

## 2018-07-05 ENCOUNTER — Encounter: Payer: Self-pay | Admitting: Nurse Practitioner

## 2018-07-05 ENCOUNTER — Ambulatory Visit (INDEPENDENT_AMBULATORY_CARE_PROVIDER_SITE_OTHER): Payer: Medicare Other | Admitting: Nurse Practitioner

## 2018-07-05 VITALS — BP 142/78 | HR 71 | Temp 98.2°F | Ht 71.0 in | Wt 284.0 lb

## 2018-07-05 DIAGNOSIS — R0981 Nasal congestion: Secondary | ICD-10-CM | POA: Diagnosis not present

## 2018-07-05 DIAGNOSIS — J069 Acute upper respiratory infection, unspecified: Secondary | ICD-10-CM | POA: Diagnosis not present

## 2018-07-05 LAB — VERITOR FLU A/B WAIVED
Influenza A: NEGATIVE
Influenza B: NEGATIVE

## 2018-07-05 MED ORDER — BENZONATATE 100 MG PO CAPS: 100.0000 mg | ORAL_CAPSULE | Freq: Three times a day (TID) | ORAL | 0 refills | Status: DC | PRN

## 2018-07-05 MED ORDER — AZITHROMYCIN 250 MG PO TABS: ORAL_TABLET | ORAL | 0 refills | Status: DC

## 2018-07-05 MED ORDER — IBUPROFEN 400 MG PO TABS: 400.0000 mg | ORAL_TABLET | Freq: Four times a day (QID) | ORAL | 2 refills | Status: AC | PRN

## 2018-07-05 NOTE — Addendum Note (Signed)
Addended by: Chevis Pretty on: 07/05/2018 03:06 PM   Modules accepted: Orders

## 2018-07-05 NOTE — Patient Instructions (Signed)

## 2018-07-05 NOTE — Progress Notes (Signed)
Subjective:    Patient ID: Aaron Shah, male    DOB: 1950-07-02, 68 y.o.   MRN: [id number]   Chief Complaint: Sinus Problem; Nasal Congestion; Cough; and Fever   HPI patioent come sin today c/o cough and congestion. Started about 4 days ago. Started out as sore throat and moved into chest. Gets pneumonia frequently.   Review of Systems  Constitutional: Positive for fever (100 last night). Negative for chills.  HENT: Positive for congestion, ear pain, rhinorrhea, sinus pressure, sinus pain and sore throat. Negative for trouble swallowing and voice change.   Respiratory: Positive for cough (productive- dark yellow).   Cardiovascular: Negative.   Gastrointestinal: Negative.   Genitourinary: Negative.   Neurological: Negative.   Psychiatric/Behavioral: Negative.   All other systems reviewed and are negative.      Objective:   Physical Exam Vitals signs and nursing note reviewed.  Constitutional:      General: He is in acute distress (mild).     Appearance: Normal appearance. He is obese.  HENT:     Right Ear: Hearing, tympanic membrane, ear canal and external ear normal.     Left Ear: Hearing, tympanic membrane, ear canal and external ear normal.     Nose: Mucosal edema, congestion and rhinorrhea present.     Right Sinus: Frontal sinus tenderness present. No maxillary sinus tenderness.     Left Sinus: Frontal sinus tenderness present. No maxillary sinus tenderness.     Mouth/Throat:     Lips: Pink.     Mouth: Mucous membranes are moist.     Pharynx: Oropharynx is clear.  Cardiovascular:     Rate and Rhythm: Normal rate and regular rhythm.     Heart sounds: Normal heart sounds.  Pulmonary:     Effort: Pulmonary effort is normal.     Breath sounds: Normal breath sounds.  Abdominal:     General: Abdomen is flat.     Palpations: Abdomen is soft.  Skin:    General: Skin is warm and dry.  Neurological:     General: No focal deficit present.     Mental Status: He is  alert and oriented to person, place, and time.  Psychiatric:        Mood and Affect: Mood normal.        Behavior: Behavior normal.    BP (!) 142/78   Pulse 71   Temp 98.2 F (36.8 C) (Oral)   Ht 5\' 11"  (1.803 m)   Wt 284 lb (128.8 kg)   BMI 39.61 kg/m         Assessment & Plan:  Aaron Shah in today with chief complaint of Sinus Problem; Nasal Congestion; Cough; and Fever   1. Nasal congestion - Veritor Flu A/B Waived  2. Upper respiratory infection with cough and congestion 1. Take meds as prescribed 2. Use a cool mist humidifier especially during the winter months and when heat has been humid. 3. Use saline nose sprays frequently 4. Saline irrigations of the nose can be very helpful if done frequently.  * 4X daily for 1 week*  * Use of a nettie pot can be helpful with this. Follow directions with this* 5. Drink plenty of fluids 6. Keep thermostat turn down low 7.For any cough or congestion  Use plain Mucinex- regular strength or max strength is fine   * Children- consult with Pharmacist for dosing 8. For fever or aces or pains- take tylenol or ibuprofen appropriate for age and weight.  *  for fevers greater than 101 orally you may alternate ibuprofen and tylenol every  3 hours.    - azithromycin (ZITHROMAX Z-PAK) 250 MG tablet; As directed  Dispense: 6 tablet; Refill: 0 - benzonatate (TESSALON PERLES) 100 MG capsule; Take 1 capsule (100 mg total) by mouth 3 (three) times daily as needed for cough.  Dispense: 20 capsule; Refill: 0  Mary-Margaret Hassell Done, FNP

## 2018-07-13 ENCOUNTER — Telehealth: Payer: Self-pay | Admitting: Family Medicine

## 2018-07-13 NOTE — Telephone Encounter (Signed)
Try delsym OTC- this cough is going to last several weeks. If develops a fever then NTBS

## 2018-07-13 NOTE — Telephone Encounter (Signed)
PT was seen last week by [facility] and the cough is not getting better only getting worse, still has some mucus, he is using tessalon pearls and cough drops that is not helping  [place]

## 2018-07-13 NOTE — Telephone Encounter (Signed)
lmtcb

## 2018-07-16 NOTE — Telephone Encounter (Signed)
Provider's advice left on voice mail.   Please call our office to confirm receiving message.

## 2018-07-23 ENCOUNTER — Ambulatory Visit: Payer: Self-pay | Admitting: Family Medicine

## 2018-07-31 ENCOUNTER — Ambulatory Visit: Payer: Self-pay | Admitting: Family Medicine

## 2018-08-21 ENCOUNTER — Telehealth: Payer: Self-pay | Admitting: Internal Medicine

## 2018-08-21 ENCOUNTER — Telehealth: Payer: Self-pay | Admitting: *Deleted

## 2018-08-21 NOTE — Telephone Encounter (Signed)
[facility] good until 6 months after 1st visit until 12/08/2018

## 2018-08-21 NOTE — Telephone Encounter (Signed)
See prior phone note open 08/21/2018

## 2018-08-21 NOTE — Telephone Encounter (Signed)
LMOVM to r/s procedure TCS with [facility]

## 2018-08-21 NOTE — Telephone Encounter (Signed)
[name] PATIENT TO RESCHEDULE HIS PROCEDURE

## 2018-08-21 NOTE — Telephone Encounter (Signed)
LMOVM for pt 

## 2018-08-21 NOTE — Telephone Encounter (Signed)
Patient called back and he is r/s'd to 7/29 at 10:30am. Patient aware will mail new instructions to him. Address verified. Called endo and LMOVM regarding appt change

## 2018-09-12 ENCOUNTER — Other Ambulatory Visit: Payer: Self-pay

## 2018-09-12 ENCOUNTER — Encounter: Payer: Self-pay | Admitting: Family Medicine

## 2018-09-12 ENCOUNTER — Ambulatory Visit (INDEPENDENT_AMBULATORY_CARE_PROVIDER_SITE_OTHER): Payer: Medicare Other | Admitting: Family Medicine

## 2018-09-12 DIAGNOSIS — G4733 Obstructive sleep apnea (adult) (pediatric): Secondary | ICD-10-CM | POA: Diagnosis not present

## 2018-09-12 DIAGNOSIS — I10 Essential (primary) hypertension: Secondary | ICD-10-CM | POA: Diagnosis not present

## 2018-09-12 DIAGNOSIS — E669 Obesity, unspecified: Secondary | ICD-10-CM

## 2018-09-12 DIAGNOSIS — Z9884 Bariatric surgery status: Secondary | ICD-10-CM | POA: Diagnosis not present

## 2018-09-12 DIAGNOSIS — E119 Type 2 diabetes mellitus without complications: Secondary | ICD-10-CM | POA: Diagnosis not present

## 2018-09-12 DIAGNOSIS — E291 Testicular hypofunction: Secondary | ICD-10-CM | POA: Diagnosis not present

## 2018-09-12 NOTE — Progress Notes (Signed)
Subjective:    Patient ID: Aaron Shah, male    DOB: [birth year], 68 y.o.   MRN: [id number]   HPI: Aaron Shah is a 68 y.o. male presenting for recheck of diabetes. Transferring from Dr. Evette Doffing. Hx of gstric bypass. Lost 180 lb and DM went away. Now has gained back 50 and DM is back. Also has OSA and CPAP. Has leaking of mask. Not getting enough air. New one on order. Getting dry mouth. Snoring through the mask.  Glucose running 110-115 fasting. Metformin caused diarrhea. Switched to Metformin ER 500 mg daily and doing much better.   Back surgery after injury at [facility]. April 2019. 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   Checked for [employer] in January. Used zithromax. No cough since.    Depression screen [facility] 2/9 07/05/2018 03/26/2018 11/29/2017 11/14/2017 10/11/2017  Decreased Interest 0 0 2 0 2  Down, Depressed, Hopeless 0 2 0 0 0  PHQ - 2 Score 0 2 2 0 2  Altered sleeping - 0 0 - 0  Tired, decreased energy - 0 0 - 1  Change in appetite - 0 0 - 0  Feeling bad or failure about yourself  - 2 1 - 2  Trouble concentrating - 0 0 - 1  Moving slowly or fidgety/restless - 0 0 - 0  Suicidal thoughts - 0 0 - 0  PHQ-9 Score - 4 3 - 6  Difficult doing work/chores - - Somewhat difficult - Somewhat difficult     Relevant past medical, surgical, family and social history reviewed and updated as indicated.  Interim medical history since our last visit reviewed. Allergies and medications reviewed and updated.  ROS:  Review of Systems  Constitutional: Negative.   HENT: Negative.   Eyes: Negative for visual disturbance.  Respiratory: Negative for cough and shortness of breath.   Cardiovascular: Negative for chest pain and leg swelling.  Gastrointestinal: Negative for abdominal pain, diarrhea, nausea and vomiting.  Genitourinary: Negative for difficulty urinating.  Musculoskeletal: Negative for arthralgias and myalgias.  Skin: Negative for rash.  Neurological:  Negative for headaches.  Psychiatric/Behavioral: Negative for sleep disturbance.     Social History   Tobacco Use  Smoking Status Never Smoker  Smokeless Tobacco Never Used       Objective:     Wt Readings from Last 3 Encounters:  07/05/18 284 lb (128.8 kg)  06/11/18 285 lb (129.3 kg)  03/26/18 288 lb (130.6 kg)     Exam deferred. Pt. [place] due to COVID 19. Phone visit performed.   Assessment & Plan:   1. Diabetes mellitus without complication ([place])   2. Hypogonadism in male   3. OSA (obstructive sleep apnea)   4. History of gastric bypass   5. Essential hypertension   6. Obesity (BMI 30-39.9)     No orders of the defined types were placed in this encounter.   No orders of the defined types were placed in this encounter.     Diagnoses and all orders for this visit:  Diabetes mellitus without complication ([place])  Hypogonadism in male  OSA (obstructive sleep apnea)  History of gastric bypass  Essential hypertension  Obesity (BMI 30-39.9)    Virtual Visit via telephone Note  I discussed the limitations, risks, security and privacy concerns of performing an evaluation and management service by telephone and the availability of in person appointments. The patient was identified with two identifiers. Pt.expressed understanding and agreed to proceed. Pt.  Is at home. Dr. Livia Snellen is in his office.  Follow Up Instructions:   I discussed the assessment and treatment plan with the patient. The patient was provided an opportunity to ask questions and all were answered. The patient agreed with the plan and demonstrated an understanding of the instructions.   The patient was advised to call back or seek an in-person evaluation if the symptoms worsen or if the condition fails to improve as anticipated.   Total minutes including chart review and phone contact time: 40   Follow up plan: Return in about 3 months (around 12/13/2018).  Claretta Fraise, MD [place]

## 2018-11-02 ENCOUNTER — Telehealth: Payer: Self-pay

## 2018-11-02 NOTE — Telephone Encounter (Signed)
Manuela Schwartz, can you obtain new VA auth (last one expires 12/08/18 and TCS is scheduled for 12/26/18).

## 2018-11-02 NOTE — Telephone Encounter (Signed)
Pt called this morning to reschedule TCS that was for 12/05/18. His job is requiring him to go out of town. TCS w/RMR rescheduled to 12/26/18 at 12:15pm. COVID appt 12/21/18 at 10:00am. Endo scheduler informed. New instructions and COVID appt mailed.

## 2018-11-05 NOTE — Telephone Encounter (Signed)
I LMOM with the VA (they are working remotely) and told them that we needed updated [place]. Because it will expire 8/1 and his procedure is 8/19

## 2018-11-27 NOTE — Telephone Encounter (Signed)
I called VA on 11/05/2018 for updated VA Auth per VA it's good through 02/06/2019. Patient may want to call the [place] to confirm his authorization.

## 2018-11-27 NOTE — Telephone Encounter (Signed)
Aaron Shah, have you heard from the [place]?

## 2018-11-27 NOTE — Telephone Encounter (Signed)
Tried to call pt, no answer, LMOVM and informed pt.

## 2018-12-14 ENCOUNTER — Telehealth: Payer: Self-pay | Admitting: Internal Medicine

## 2018-12-14 NOTE — Telephone Encounter (Signed)
Patient wants to push TCS out until October. VA referral is only good until 02/06/2019. Manuela Schwartz can we see if the [place] will extend his referral out before we reschedule him? Thanks

## 2018-12-14 NOTE — Telephone Encounter (Signed)
Pt is scheduled with RMR on 8/19 and wants to reschedule. He wants to push it out Sept/Oct. Please call [phone]

## 2018-12-17 NOTE — Telephone Encounter (Signed)
The original [place] referral was from NOV 2019. He will need to follow up with the [facility] prior to procedure. VA referral good until 6 months after 1st visit until 12/08/2018 and was extended once already to 02/06/2019

## 2018-12-21 ENCOUNTER — Other Ambulatory Visit (HOSPITAL_COMMUNITY)
Admission: RE | Admit: 2018-12-21 | Discharge: 2018-12-21 | Disposition: A | Discharge status: Home / Self Care | Payer: No Typology Code available for payment source | Source: Ambulatory Visit | Attending: Internal Medicine | Admitting: Internal Medicine

## 2018-12-21 ENCOUNTER — Other Ambulatory Visit: Payer: Self-pay

## 2018-12-21 DIAGNOSIS — Z01812 Encounter for preprocedural laboratory examination: Secondary | ICD-10-CM | POA: Diagnosis present

## 2018-12-21 DIAGNOSIS — Z20828 Contact with and (suspected) exposure to other viral communicable diseases: Secondary | ICD-10-CM | POA: Diagnosis not present

## 2018-12-21 LAB — SARS CORONAVIRUS 2 (TAT 6-24 HRS): SARS Coronavirus 2: NEGATIVE

## 2018-12-24 ENCOUNTER — Telehealth: Payer: Self-pay | Admitting: Internal Medicine

## 2018-12-24 NOTE — Telephone Encounter (Signed)
Pre service called about patient's procedure on 12/26/2018 regarding VA Auth. I can't find the original [place] authorization. Pt has rescheduled procedure several times since NOV 2019. I have called the [place] and LMOM that we need the [place] prior to his procedure. Pre Service # is [phone]

## 2018-12-24 NOTE — Telephone Encounter (Signed)
Noted. Aaron Shah has already called the VA to get updated referral sent over and she also called pre-service center

## 2018-12-25 NOTE — Telephone Encounter (Signed)
I have left 3 messages with the VA to clarify that [place] was extended to 02/06/2019 and no reply back.

## 2018-12-26 ENCOUNTER — Encounter (HOSPITAL_COMMUNITY): Payer: Self-pay

## 2018-12-26 ENCOUNTER — Ambulatory Visit (HOSPITAL_COMMUNITY)
Admission: RE | Admit: 2018-12-26 | Discharge: 2018-12-26 | Disposition: A | Discharge status: Home / Self Care | Payer: No Typology Code available for payment source | Attending: Internal Medicine | Admitting: Internal Medicine

## 2018-12-26 ENCOUNTER — Other Ambulatory Visit: Payer: Self-pay

## 2018-12-26 ENCOUNTER — Encounter (HOSPITAL_COMMUNITY)
Admission: RE | Disposition: A | Discharge status: Home / Self Care | Payer: Self-pay | Source: Home / Self Care | Attending: Internal Medicine

## 2018-12-26 DIAGNOSIS — Z791 Long term (current) use of non-steroidal anti-inflammatories (NSAID): Secondary | ICD-10-CM | POA: Diagnosis not present

## 2018-12-26 DIAGNOSIS — K640 First degree hemorrhoids: Secondary | ICD-10-CM | POA: Insufficient documentation

## 2018-12-26 DIAGNOSIS — Z86010 Personal history of colonic polyps: Secondary | ICD-10-CM

## 2018-12-26 DIAGNOSIS — J45909 Unspecified asthma, uncomplicated: Secondary | ICD-10-CM | POA: Diagnosis not present

## 2018-12-26 DIAGNOSIS — Z9884 Bariatric surgery status: Secondary | ICD-10-CM | POA: Diagnosis not present

## 2018-12-26 DIAGNOSIS — D120 Benign neoplasm of cecum: Secondary | ICD-10-CM | POA: Insufficient documentation

## 2018-12-26 DIAGNOSIS — K635 Polyp of colon: Secondary | ICD-10-CM

## 2018-12-26 DIAGNOSIS — E1136 Type 2 diabetes mellitus with diabetic cataract: Secondary | ICD-10-CM | POA: Insufficient documentation

## 2018-12-26 DIAGNOSIS — M1990 Unspecified osteoarthritis, unspecified site: Secondary | ICD-10-CM | POA: Insufficient documentation

## 2018-12-26 DIAGNOSIS — Z8051 Family history of malignant neoplasm of kidney: Secondary | ICD-10-CM | POA: Insufficient documentation

## 2018-12-26 DIAGNOSIS — Z79899 Other long term (current) drug therapy: Secondary | ICD-10-CM | POA: Insufficient documentation

## 2018-12-26 DIAGNOSIS — G4730 Sleep apnea, unspecified: Secondary | ICD-10-CM | POA: Diagnosis not present

## 2018-12-26 DIAGNOSIS — I10 Essential (primary) hypertension: Secondary | ICD-10-CM | POA: Insufficient documentation

## 2018-12-26 DIAGNOSIS — Z1211 Encounter for screening for malignant neoplasm of colon: Secondary | ICD-10-CM | POA: Diagnosis present

## 2018-12-26 DIAGNOSIS — Z800 Family history of malignant neoplasm of digestive organs: Secondary | ICD-10-CM | POA: Diagnosis not present

## 2018-12-26 DIAGNOSIS — Z825 Family history of asthma and other chronic lower respiratory diseases: Secondary | ICD-10-CM | POA: Diagnosis not present

## 2018-12-26 DIAGNOSIS — Z808 Family history of malignant neoplasm of other organs or systems: Secondary | ICD-10-CM | POA: Diagnosis not present

## 2018-12-26 DIAGNOSIS — Z9842 Cataract extraction status, left eye: Secondary | ICD-10-CM | POA: Diagnosis not present

## 2018-12-26 DIAGNOSIS — Z801 Family history of malignant neoplasm of trachea, bronchus and lung: Secondary | ICD-10-CM | POA: Insufficient documentation

## 2018-12-26 DIAGNOSIS — Z7984 Long term (current) use of oral hypoglycemic drugs: Secondary | ICD-10-CM | POA: Diagnosis not present

## 2018-12-26 HISTORY — PX: POLYPECTOMY: SHX5525

## 2018-12-26 HISTORY — PX: COLONOSCOPY: SHX5424

## 2018-12-26 LAB — GLUCOSE, CAPILLARY: Glucose-Capillary: 108 mg/dL — ABNORMAL HIGH (ref 70–99)

## 2018-12-26 SURGERY — COLONOSCOPY
Anesthesia: Moderate Sedation

## 2018-12-26 MED ORDER — SODIUM CHLORIDE 0.9 % IV SOLN
INTRAVENOUS | Status: DC
  Administered 2018-12-26: 12:00:00 via INTRAVENOUS

## 2018-12-26 MED ORDER — ONDANSETRON HCL 4 MG/2ML IJ SOLN
INTRAMUSCULAR | Status: AC
  Filled 2018-12-26: qty 2

## 2018-12-26 MED ORDER — MIDAZOLAM HCL 5 MG/5ML IJ SOLN
INTRAMUSCULAR | Status: DC | PRN
  Administered 2018-12-26 (×2): 1 mg via INTRAVENOUS
  Administered 2018-12-26: 2 mg via INTRAVENOUS

## 2018-12-26 MED ORDER — MIDAZOLAM HCL 5 MG/5ML IJ SOLN
INTRAMUSCULAR | Status: AC
  Filled 2018-12-26: qty 10

## 2018-12-26 MED ORDER — MEPERIDINE HCL 50 MG/ML IJ SOLN
INTRAMUSCULAR | Status: AC
  Filled 2018-12-26: qty 1

## 2018-12-26 MED ORDER — ONDANSETRON HCL 4 MG/2ML IJ SOLN
INTRAMUSCULAR | Status: DC | PRN
  Administered 2018-12-26: 4 mg via INTRAVENOUS

## 2018-12-26 MED ORDER — MEPERIDINE HCL 100 MG/ML IJ SOLN
INTRAMUSCULAR | Status: DC | PRN
  Administered 2018-12-26: 15 mg via INTRAVENOUS
  Administered 2018-12-26: 25 mg via INTRAVENOUS
  Administered 2018-12-26: 10 mg via INTRAVENOUS

## 2018-12-26 NOTE — H&P (Signed)
@LOGO @   Primary Care Physician:  Claretta Fraise, MD Primary Gastroenterologist:  Dr. Gala Romney  Pre-Procedure History & Physical: HPI:  Aaron Shah is a 68 y.o. male here for surveillance colonoscopy.  History of colonic polyps removed elsewhere.  Intermittent paper hematochezia as well.  Past Medical History:  Diagnosis Date  . Arthritis    entire spine, C4,5,6- HNP, seeing MD at [facility]   . Asthma    h/o- uses Proventil when he has a flare   . Cataract   . Concussion 05/2013   resulted from a fall, had been rx for depression after that but developed feelings of homicide & suicide, med. was d/c'd  . Diabetes mellitus without complication ([place]) 4888   oral med.   . Diabetes mellitus without complication ([place])   . H/O exercise stress test 2016   pt,. reports that it was done thru the [place] in [place]  . Hypertension   . Pneumonia 1970's   [place], pt. reports that he is prone to getting bronchitis   . Sleep apnea 2016   CPAP machine but not using at present, test done in [place] , will have a new study here in [place]     Past Surgical History:  Procedure Laterality Date  . CATARACT EXTRACTION W/PHACO Left 08/19/2015   Procedure: CATARACT EXTRACTION PHACO AND INTRAOCULAR LENS PLACEMENT (IOC) LEFT EYE;  Surgeon: Marylynn Pearson, MD;  Location: [facility];  Service: Ophthalmology;  Laterality: Left;  . EYE SURGERY     glass removed fr. R eye   . 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 prior to   . HERNIA REPAIR     inguinal- L   . IRRIGATION AND DEBRIDEMENT SEBACEOUS CYST Right    arm- upper, cysts both removed  . LEFT HEART CATH AND CORONARY ANGIOGRAPHY N/A 02/15/2017   Procedure: LEFT HEART CATH AND CORONARY ANGIOGRAPHY;  Surgeon: Leonie Man, MD;  Location: [facility];  Service: Cardiovascular;  Laterality: N/A;  . TESTICLE REMOVAL  1960's, 2001   both removed   . TONSILLECTOMY      Prior to Admission  medications   Medication Sig Start Date End Date Taking? Authorizing Provider  Ascorbic Acid (VITAMIN C PO) Take 564 mg by mouth daily.   Yes [provider]  Cholecalciferol (VITAMIN D3) 50 MCG (2000 UT) TABS Take 2,000 Units by mouth daily.   Yes [provider]  hydrocortisone (ANUSOL-HC) 25 MG suppository Place 1 suppository (25 mg total) rectally 2 (two) times daily. Patient taking differently: Place 25 mg rectally 2 (two) times daily as needed for hemorrhoids.  08/14/17  Yes Martin, Mary-Margaret, FNP  lisinopril (PRINIVIL,ZESTRIL) 10 MG tablet Take 5 mg by mouth daily.    Yes [provider]  metFORMIN (GLUCOPHAGE) 500 MG tablet Take 500 mg by mouth daily.    Yes [provider]  testosterone cypionate (DEPOTESTOSTERONE CYPIONATE) 200 MG/ML injection Inject 200 mg into the muscle every 14 (fourteen) days.    Yes [provider]  albuterol (PROVENTIL HFA;VENTOLIN HFA) 108 (90 Base) MCG/ACT inhaler Inhale 1 puff into the lungs every 6 (six) hours as needed for wheezing or shortness of breath.  02/19/16   [provider]  azithromycin (ZITHROMAX Z-PAK) 250 MG tablet As directed Patient not taking: Reported on 12/21/2018 07/05/18   Chevis Pretty, FNP  gabapentin (NEURONTIN) 300 MG capsule Take 300 mg by mouth at bedtime as needed (pain.).  [provider]  ibuprofen (ADVIL,MOTRIN) 400 MG tablet Take 1 tablet (400 mg total) by mouth every 6 (six) hours as needed for mild pain or moderate pain. Patient taking differently: Take 400 mg by mouth every 6 (six) hours as needed for mild pain (for stiffness).  07/05/18   Chevis Pretty, FNP    Allergies as of 06/11/2018  . (No Known Allergies)    Family History  Problem Relation Age of Onset  . Cancer Mother        Lung  . Cancer Father        Kidney, Lung  . Emphysema Father   . COPD Father   . Cancer Sister        Skin   . Cancer Paternal Grandfather         Intestinal    Social History   Socioeconomic History  . Marital status: Married    Spouse name: Not on file  . Number of children: Not on file  . Years of education: Not on file  . Highest education level: Not on file  Occupational History  . Not on file  Social Needs  . Financial resource strain: Not on file  . Food insecurity    Worry: Not on file    Inability: Not on file  . Transportation needs    Medical: Not on file    Non-medical: Not on file  Tobacco Use  . Smoking status: Never Smoker  . Smokeless tobacco: Never Used  Substance and Sexual Activity  . Alcohol use: Yes    Comment: rare use of beer  . Drug use: No  . Sexual activity: Not on file  Lifestyle  . Physical activity    Days per week: Not on file    Minutes per session: Not on file  . Stress: Not on file  Relationships  . Social Herbalist on phone: Not on file    Gets together: Not on file    Attends religious service: Not on file    Active member of club or organization: Not on file    Attends meetings of clubs or organizations: Not on file    Relationship status: Not on file  . Intimate partner violence    Fear of current or ex partner: Not on file    Emotionally abused: Not on file    Physically abused: Not on file    Forced sexual activity: Not on file  Other Topics Concern  . Not on file  Social History Narrative   ** Merged History Encounter **        Review of Systems: See HPI, otherwise negative ROS  Physical Exam: BP 135/66   Temp 98.7 F (37.1 C) (Oral)   Resp 15   Ht 5\' 11"  (1.803 m)   Wt 122.5 kg   SpO2 100%   BMI 37.66 kg/m  General:   Alert,  Well-developed, well-nourished, pleasant and cooperative in NAD Neck:  Supple; no masses or thyromegaly. No significant cervical adenopathy. Lungs:  Clear throughout to auscultation.   No wheezes, crackles, or rhonchi. No acute distress. Heart:  Regular rate and rhythm; no murmurs, clicks, rubs,  or gallops. Abdomen:  Non-distended, normal bowel sounds.  Soft and nontender without appreciable mass or hepatosplenomegaly.    Impression/Plan: 68 year old gentleman history of colonic polyps removed elsewhere.  Here for surveillance examination. Intermittent paper hematochezia.  I have offered the patient a colonoscopy today per plan.   The risks, benefits, limitations,  alternatives and imponderables have been reviewed with the patient. Questions have been answered. All parties are agreeable.      Notice: This dictation was prepared with Dragon dictation along with smaller phrase technology. Any transcriptional errors that result from this process are unintentional and may not be corrected upon review.

## 2018-12-26 NOTE — Telephone Encounter (Signed)
Received VM from [place] with the [place] and she confirmed VA extended auth through 02/06/2019.

## 2018-12-26 NOTE — Discharge Instructions (Signed)
Colonoscopy Discharge Instructions  Read the instructions outlined below and refer to this sheet in the next few weeks. These discharge instructions provide you with general information on caring for yourself after you leave the hospital. Your doctor may also give you specific instructions. While your treatment has been planned according to the most current medical practices available, unavoidable complications occasionally occur. If you have any problems or questions after discharge, call Dr. Gala Romney at [phone]. ACTIVITY  You may resume your regular activity, but move at a slower pace for the next 24 hours.   Take frequent rest periods for the next 24 hours.   Walking will help get rid of the air and reduce the bloated feeling in your belly (abdomen).   No driving for 24 hours (because of the medicine (anesthesia) used during the test).    Do not sign any important legal documents or operate any machinery for 24 hours (because of the anesthesia used during the test).  NUTRITION  Drink plenty of fluids.   You may resume your normal diet as instructed by your doctor.   Begin with a light meal and progress to your normal diet. Heavy or fried foods are harder to digest and may make you feel sick to your stomach (nauseated).   Avoid alcoholic beverages for 24 hours or as instructed.  MEDICATIONS  You may resume your normal medications unless your doctor tells you otherwise.  WHAT YOU CAN EXPECT TODAY  Some feelings of bloating in the abdomen.   Passage of more gas than usual.   Spotting of blood in your stool or on the toilet paper.  IF YOU HAD POLYPS REMOVED DURING THE COLONOSCOPY:  No aspirin products for 7 days or as instructed.   No alcohol for 7 days or as instructed.   Eat a soft diet for the next 24 hours.  FINDING OUT THE RESULTS OF YOUR TEST Not all test results are available during your visit. If your test results are not back during the visit, make an appointment  with your caregiver to find out the results. Do not assume everything is normal if you have not heard from your caregiver or the medical facility. It is important for you to follow up on all of your test results.  SEEK IMMEDIATE MEDICAL ATTENTION IF:  You have more than a spotting of blood in your stool.   Your belly is swollen (abdominal distention).   You are nauseated or vomiting.   You have a temperature over 101.   You have abdominal pain or discomfort that is severe or gets worse throughout the day.    Colon polyp and hemorrhoid information provided  Hemorrhoid banding pamphlet provided  Office visit with [place] in 3 months Roseanne Kaufman)  Further recommendations to follow pending review of pathology report  I called wife, Arrie Aran at [phone]; call rolled to voicemail.   Hemorrhoids Hemorrhoids are swollen veins that may develop:  In the butt (rectum). These are called internal hemorrhoids.  Around the opening of the butt (anus). These are called external hemorrhoids. Hemorrhoids can cause pain, itching, or bleeding. Most of the time, they do not cause serious problems. They usually get better with diet changes, lifestyle changes, and other home treatments. What are the causes? This condition may be caused by:  Having trouble pooping (constipation).  Pushing hard (straining) to poop.  Watery poop (diarrhea).  Pregnancy.  Being very overweight (obese).  Sitting for long periods of time.  Heavy lifting or other activity  that causes you to strain.  Anal sex.  Riding a bike for a long period of time. What are the signs or symptoms? Symptoms of this condition include:  Pain.  Itching or soreness in the butt.  Bleeding from the butt.  Leaking poop.  Swelling in the area.  One or more lumps around the opening of your butt. How is this diagnosed? A doctor can often diagnose this condition by looking at the affected area. The doctor may also:  Do an exam  that involves feeling the area with a gloved hand (digital rectal exam).  Examine the area inside your butt using a small tube (anoscope).  Order blood tests. This may be done if you have lost a lot of blood.  Have you get a test that involves looking inside the colon using a flexible tube with a camera on the end (sigmoidoscopy or colonoscopy). How is this treated? This condition can usually be treated at home. Your doctor may tell you to change what you eat, make lifestyle changes, or try home treatments. If these do not help, procedures can be done to remove the hemorrhoids or make them smaller. These may involve:  Placing rubber bands at the base of the hemorrhoids to cut off their blood supply.  Injecting medicine into the hemorrhoids to shrink them.  Shining a type of light energy onto the hemorrhoids to cause them to fall off.  Doing surgery to remove the hemorrhoids or cut off their blood supply. Follow these instructions at home: Eating and drinking   Eat foods that have a lot of fiber in them. These include whole grains, beans, nuts, fruits, and vegetables.  Ask your doctor about taking products that have added fiber (fibersupplements).  Reduce the amount of fat in your diet. You can do this by: ? Eating low-fat dairy products. ? Eating less red meat. ? Avoiding processed foods.  Drink enough fluid to keep your pee (urine) pale yellow. Managing pain and swelling   Take a warm-water bath (sitz bath) for 20 minutes to ease pain. Do this 3-4 times a day. You may do this in a bathtub or using a portable sitz bath that fits over the toilet.  If told, put ice on the painful area. It may be helpful to use ice between your warm baths. ? Put ice in a plastic bag. ? Place a towel between your skin and the bag. ? Leave the ice on for 20 minutes, 2-3 times a day. General instructions  Take over-the-counter and prescription medicines only as told by your doctor. ? Medicated  creams and medicines may be used as told.  Exercise often. Ask your doctor how much and what kind of exercise is best for you.  Go to the bathroom when you have the urge to poop. Do not wait.  Avoid pushing too hard when you poop.  Keep your butt dry and clean. Use wet toilet paper or moist towelettes after pooping.  Do not sit on the toilet for a long time.  Keep all follow-up visits as told by your doctor. This is important. Contact a doctor if you:  Have pain and swelling that do not get better with treatment or medicine.  Have trouble pooping.  Cannot poop.  Have pain or swelling outside the area of the hemorrhoids. Get help right away if you have:  Bleeding that will not stop. Summary  Hemorrhoids are swollen veins in the butt or around the opening of the butt.  They  can cause pain, itching, or bleeding.  Eat foods that have a lot of fiber in them. These include whole grains, beans, nuts, fruits, and vegetables.  Take a warm-water bath (sitz bath) for 20 minutes to ease pain. Do this 3-4 times a day. This information is not intended to replace advice given to you by your health care provider. Make sure you discuss any questions you have with your health care provider. Document Released: 02/02/2008 Document Revised: 05/03/2018 Document Reviewed: 09/14/2017 Elsevier Patient Education  [place].  Colon Polyps  Polyps are tissue growths inside the body. Polyps can grow in many places, including the large intestine (colon). A polyp may be a round bump or a mushroom-shaped growth. You could have one polyp or several. Most colon polyps are noncancerous (benign). However, some colon polyps can become cancerous over time. Finding and removing the polyps early can help prevent this. What are the causes? The exact cause of colon polyps is not known. What increases the risk? You are more likely to develop this condition if you:  Have a family history of colon cancer  or colon polyps.  Are older than 57 or older than 45 if you are African American.  Have inflammatory bowel disease, such as ulcerative colitis or Crohn's disease.  Have certain hereditary conditions, such as: ? Familial adenomatous polyposis. ? Lynch syndrome. ? Turcot syndrome. ? Peutz-Jeghers syndrome.  Are overweight.  Smoke cigarettes.  Do not get enough exercise.  Drink too much alcohol.  Eat a diet that is high in fat and red meat and low in fiber.  Had childhood cancer that was treated with abdominal radiation. What are the signs or symptoms? Most polyps do not cause symptoms. If you have symptoms, they may include:  Blood coming from your rectum when having a bowel movement.  Blood in your stool. The stool may look dark red or black.  Abdominal pain.  A change in bowel habits, such as constipation or diarrhea. How is this diagnosed? This condition is diagnosed with a colonoscopy. This is a procedure in which a lighted, flexible scope is inserted into the anus and then passed into the colon to examine the area. Polyps are sometimes found when a colonoscopy is done as part of routine cancer screening tests. How is this treated? Treatment for this condition involves removing any polyps that are found. Most polyps can be removed during a colonoscopy. Those polyps will then be tested for cancer. Additional treatment may be needed depending on the results of testing. Follow these instructions at home: Lifestyle  Maintain a healthy weight, or lose weight if recommended by your health care provider.  Exercise every day or as told by your health care provider.  Do not use any products that contain nicotine or tobacco, such as cigarettes and e-cigarettes. If you need help quitting, ask your health care provider.  If you drink alcohol, limit how much you have: ? 0-1 drink a day for women. ? 0-2 drinks a day for men.  Be aware of how much alcohol is in your drink. In  the [place] one drink equals one 12 oz bottle of beer (355 mL), one 5 oz glass of wine (148 mL), or one 1 oz shot of hard liquor (44 mL). Eating and drinking   Eat foods that are high in fiber, such as fruits, vegetables, and whole grains.  Eat foods that are high in calcium and vitamin D, such as milk, cheese, yogurt, eggs, liver, fish, and  broccoli.  Limit foods that are high in fat, such as fried foods and desserts.  Limit the amount of red meat and processed meat you eat, such as hot dogs, sausage, bacon, and lunch meats. General instructions  Keep all follow-up visits as told by your health care provider. This is important. ? This includes having regularly scheduled colonoscopies. ? Talk to your health care provider about when you need a colonoscopy. Contact a health care provider if:  You have new or worsening bleeding during a bowel movement.  You have new or increased blood in your stool.  You have a change in bowel habits.  You lose weight for no known reason. Summary  Polyps are tissue growths inside the body. Polyps can grow in many places, including the colon.  Most colon polyps are noncancerous (benign), but some can become cancerous over time.  This condition is diagnosed with a colonoscopy.  Treatment for this condition involves removing any polyps that are found. Most polyps can be removed during a colonoscopy. This information is not intended to replace advice given to you by your health care provider. Make sure you discuss any questions you have with your health care provider. Document Released: 01/20/2004 Document Revised: 08/10/2017 Document Reviewed: 08/10/2017 Elsevier Patient Education  2020 [employer].

## 2018-12-26 NOTE — Op Note (Addendum)
[facility] Patient Name: Aaron Shah Procedure Date: 12/26/2018 12:42 PM MRN: [id number] Date of Birth: Aug 16, 1950 Attending MD: Norvel Richards , MD CSN: [id number] Age: 68 Admit Type: Outpatient Procedure:                Colonoscopy Indications:              High risk colon cancer surveillance: Personal                            history of colonic polyps Providers:                Norvel Richards, MD, Charlsie Quest. Theda Sers RN, RN,                            Nelma Rothman, Technician Referring MD:              Medicines:                Midazolam 4 mg IV, Meperidine 50 mg IV, Ondansetron                            4 mg IV Complications:            No immediate complications. Estimated Blood Loss:     Estimated blood loss was minimal. Procedure:                Pre-Anesthesia Assessment:                           - Prior to the procedure, a History and Physical                            was performed, and patient medications and                            allergies were reviewed. The patient's tolerance of                            previous anesthesia was also reviewed. The risks                            and benefits of the procedure and the sedation                            options and risks were discussed with the patient.                            All questions were answered, and informed consent                            was obtained. Prior Anticoagulants: The patient has                            taken no previous anticoagulant or antiplatelet  agents. ASA Grade Assessment: II - A patient with                            mild systemic disease. After reviewing the risks                            and benefits, the patient was deemed in                            satisfactory condition to undergo the procedure.                           After obtaining informed consent, the colonoscope                            was passed under direct  vision. Throughout the                            procedure, the patient's blood pressure, pulse, and                            oxygen saturations were monitored continuously. The                            CF-HQ190L ([id number]) scope was introduced through                            the anus and advanced to the the cecum, identified                            by appendiceal orifice and ileocecal valve. The                            colonoscopy was performed without difficulty. The                            patient tolerated the procedure well. The quality                            of the bowel preparation was adequate. Scope In: 12:50:00 PM Scope Out: 1:03:53 PM Scope Withdrawal Time: 0 hours 8 minutes 51 seconds  Total Procedure Duration: 0 hours 13 minutes 53 seconds  Findings:      The perianal and digital rectal examinations were normal.      A 6 mm polyp was found in the cecum. The polyp was sessile. The polyp       was removed with a cold snare. Resection and retrieval were complete.       Estimated blood loss was minimal.      Non-bleeding internal hemorrhoids were found during retroflexion. The       hemorrhoids were moderate, medium-sized and Grade I (internal       hemorrhoids that do not prolapse).      The exam was otherwise without abnormality on direct and retroflexion       views. Impression:               -  One 6 mm polyp in the cecum, removed with a cold                            snare. Resected and retrieved.                           - Non-bleeding internal hemorrhoids.                           - The examination was otherwise normal on direct                            and retroflexion views. Moderate Sedation:      Moderate (conscious) sedation was administered by the endoscopy nurse       and supervised by the endoscopist. The following parameters were       monitored: oxygen saturation, heart rate, blood pressure, and response       to care. Total physician  intraservice time was 19 minutes. Recommendation:           - Patient has a contact number available for                            emergencies. The signs and symptoms of potential                            delayed complications were discussed with the                            patient. Return to normal activities tomorrow.                            Written discharge instructions were provided to the                            patient.                           - Resume previous diet.                           - Continue present medications.                           - Repeat colonoscopy date to be determined after                            pending pathology results are reviewed for                            surveillance based on pathology results.                           - Return to GI office in 3 months. Pt appears to be  a good candidate for hemorrhoid banding. Procedure Code(s):        --- Professional ---                           [phone], Colonoscopy, flexible; with removal of                            tumor(s), polyp(s), or other lesion(s) by snare                            technique                           G0500, Moderate sedation services provided by the                            same physician or other qualified health care                            professional performing a gastrointestinal                            endoscopic service that sedation supports,                            requiring the presence of an independent trained                            observer to assist in the monitoring of the                            patient's level of consciousness and physiological                            status; initial 15 minutes of intra-service time;                            patient age 30 years or older (additional time may                            be reported with [phone], as appropriate) Diagnosis Code(s):        --- Professional  ---                           Z86.010, Personal history of colonic polyps                           K63.5, Polyp of colon                           K64.0, First degree hemorrhoids CPT copyright 2019 American Medical Association. All rights reserved. The codes documented in this report are preliminary and upon coder review may  be revised to meet current compliance requirements. Cristopher Estimable. Tarren Velardi, MD Norvel Richards, MD 12/26/2018 1:12:45 PM This report has been signed electronically. Number  of Addenda: 0

## 2018-12-30 ENCOUNTER — Encounter: Payer: Self-pay | Admitting: Internal Medicine

## 2019-01-01 ENCOUNTER — Encounter (HOSPITAL_COMMUNITY): Payer: Self-pay | Admitting: Internal Medicine

## 2019-03-28 ENCOUNTER — Other Ambulatory Visit: Payer: Self-pay

## 2019-03-28 ENCOUNTER — Ambulatory Visit (INDEPENDENT_AMBULATORY_CARE_PROVIDER_SITE_OTHER): Payer: Medicare Other | Admitting: Gastroenterology

## 2019-03-28 ENCOUNTER — Encounter: Payer: Self-pay | Admitting: Gastroenterology

## 2019-03-28 DIAGNOSIS — K625 Hemorrhage of anus and rectum: Secondary | ICD-10-CM | POA: Diagnosis not present

## 2019-03-28 MED ORDER — HYDROCORTISONE (PERIANAL) 2.5 % EX CREA: 1.0000 "application " | TOPICAL_CREAM | Freq: Four times a day (QID) | CUTANEOUS | 1 refills | Status: AC

## 2019-03-28 NOTE — Progress Notes (Signed)
Referring Provider: Claretta Fraise, MD Primary Care Physician:  Claretta Fraise, MD  Primary GI: Dr. Gala Romney   Chief Complaint  Patient presents with  . pp f/u  . Rectal Bleeding    wife thinks he has a tear    HPI:   Aaron Shah is a 68 y.o. male presenting today with a history of colon polyps, with most recent colonoscopy several months ago. Will need surveillance in 5 years due to adenomas. Known internal hemorrhoids. Presents today in follow-up.   Rectal bleeding with BMs, whether soft or hard. Blood will be dripping. Has lots of gas. Always been gassy, no matter what he eats. Worried about a tear. Wife thinks she has a tear. Had been using suppositories. Not eating a lot of food. No BM in 2 days. Will have well-formed at times and sometimes formed but in many pieces. Can sometimes have a sharp pain with passing a BM. Sometimes unrelated to BMs. Has been having the pain for about a year. Driving down the road at times will feel like someone has stuck a knife up into his rectum. Toilet time 30-45 minutes.   History of gastric bypass. Was 400 and lowest 218.     Past Medical History:  Diagnosis Date  . Arthritis    entire spine, C4,5,6- HNP, seeing MD at [facility]   . Asthma    h/o- uses Proventil when he has a flare   . Cataract   . Concussion 05/2013   resulted from a fall, had been rx for depression after that but developed feelings of homicide & suicide, med. was d/c'd  . Diabetes mellitus without complication ([place]) [id number]   oral med.   . Diabetes mellitus without complication ([place])   . H/O exercise stress test 2016   pt,. reports that it was done thru the [place] in [place]  . Hypertension   . Pneumonia 1970's   [place], pt. reports that he is prone to getting bronchitis   . Sleep apnea 2016   CPAP machine but not using at present, test done in [place] , will have a new study here in [place]     Past Surgical History:  Procedure  Laterality Date  . CATARACT EXTRACTION W/PHACO Left 08/19/2015   Procedure: CATARACT EXTRACTION PHACO AND INTRAOCULAR LENS PLACEMENT (IOC) LEFT EYE;  Surgeon: Marylynn Pearson, MD;  Location: [place];  Service: Ophthalmology;  Laterality: Left;  . COLONOSCOPY N/A 12/26/2018   Dr. Gala Romney: one 6 mm tubular adenoma in cecum. Non-bleeding internal hemorrhoids. Surveillance in 5 years  . EYE SURGERY     glass removed fr. R eye   . GASTRIC BYPASS  12/2004   [place], [place], weight was 400lbs. prior to   . HERNIA REPAIR     inguinal- L   . IRRIGATION AND DEBRIDEMENT SEBACEOUS CYST Right    arm- upper, cysts both removed  . LEFT HEART CATH AND CORONARY ANGIOGRAPHY N/A 02/15/2017   Procedure: LEFT HEART CATH AND CORONARY ANGIOGRAPHY;  Surgeon: Leonie Man, MD;  Location: [place] [facility];  Service: Cardiovascular;  Laterality: N/A;  . POLYPECTOMY  12/26/2018   Procedure: POLYPECTOMY;  Surgeon: Daneil Dolin, MD;  [facility];  Service: Endoscopy;;  . TESTICLE REMOVAL  1960's, 2001   both removed   . TONSILLECTOMY      Current Outpatient Medications  Medication Sig Dispense Refill  . albuterol (PROVENTIL HFA;VENTOLIN HFA) 108 (90 Base) MCG/ACT  inhaler Inhale 1 puff into the lungs every 6 (six) hours as needed for wheezing or shortness of breath.     . Ascorbic Acid (VITAMIN C PO) Take 564 mg by mouth daily.    . Cholecalciferol (VITAMIN D3) 50 MCG (2000 UT) TABS Take 2,000 Units by mouth daily.    Marland Kitchen gabapentin (NEURONTIN) 300 MG capsule Take 300 mg by mouth at bedtime as needed (pain.).    Marland Kitchen hydrocortisone (ANUSOL-HC) 25 MG suppository Place 1 suppository (25 mg total) rectally 2 (two) times daily. (Patient taking differently: Place 25 mg rectally 2 (two) times daily as needed for hemorrhoids. ) 12 suppository 0  . ibuprofen (ADVIL,MOTRIN) 400 MG tablet Take 1 tablet (400 mg total) by mouth every 6 (six) hours as needed for mild pain or moderate pain. (Patient taking differently: Take  400 mg by mouth every 6 (six) hours as needed for mild pain (for stiffness). ) 30 tablet 2  . lisinopril (PRINIVIL,ZESTRIL) 10 MG tablet Take 10 mg by mouth daily.     . metFORMIN (GLUCOPHAGE) 500 MG tablet Take 500 mg by mouth 2 (two) times daily.     Marland Kitchen testosterone cypionate (DEPOTESTOSTERONE CYPIONATE) 200 MG/ML injection Inject 200 mg into the muscle every 14 (fourteen) days.     Marland Kitchen azithromycin (ZITHROMAX Z-PAK) 250 MG tablet As directed (Patient not taking: Reported on 12/21/2018) 6 tablet 0   No current facility-administered medications for this visit.     Allergies as of 03/28/2019  . (No Known Allergies)    Family History  Problem Relation Age of Onset  . Cancer Mother        Lung  . Cancer Father        Kidney, Lung  . Emphysema Father   . COPD Father   . Cancer Sister        Skin   . Cancer Paternal Grandfather        Intestinal    Social History   Socioeconomic History  . Marital status: Married    Spouse name: Not on file  . Number of children: Not on file  . Years of education: Not on file  . Highest education level: Not on file  Occupational History  . Not on file  Social Needs  . Financial resource strain: Not on file  . Food insecurity    Worry: Not on file    Inability: Not on file  . Transportation needs    Medical: Not on file    Non-medical: Not on file  Tobacco Use  . Smoking status: Never Smoker  . Smokeless tobacco: Never Used  Substance and Sexual Activity  . Alcohol use: Not Currently    Comment: rare use of beer  . Drug use: No  . Sexual activity: Not on file  Lifestyle  . Physical activity    Days per week: Not on file    Minutes per session: Not on file  . Stress: Not on file  Relationships  . Social Herbalist on phone: Not on file    Gets together: Not on file    Attends religious service: Not on file    Active member of club or organization: Not on file    Attends meetings of clubs or organizations: Not on file     Relationship status: Not on file  Other Topics Concern  . Not on file  Social History Narrative   ** Merged History Encounter **  Review of Systems: Gen: Denies fever, chills, anorexia. Denies fatigue, weakness, weight loss.  CV: Denies chest pain, palpitations, syncope, peripheral edema, and claudication. Resp: Denies dyspnea at rest, cough, wheezing, coughing up blood, and pleurisy. GI:see HPI Derm: Denies rash, itching, dry skin Psych: Denies depression, anxiety, memory loss, confusion. No homicidal or suicidal ideation.  Heme: see HPI  Physical Exam: BP (!) 146/67   Pulse 66   Temp (!) 96.9 F (36.1 C) (Temporal)   Ht 5\' 11"  (1.803 m)   Wt 271 lb 6.4 oz (123.1 kg)   BMI 37.85 kg/m  General:   Alert and oriented. No distress noted. Pleasant and cooperative.  Head:  Normocephalic and atraumatic. Abdomen:  +BS, soft, non-tender and non-distended. No rebound or guarding. No HSM or masses noted. Rectal: no obvious anal fissure. No external hemorrhoids. Internal exam with likely enlarged internal hemorrhoids, actually palpable on exam.  Msk:  Symmetrical without gross deformities. Normal posture. Extremities:  Without edema. Neurologic:  Alert and  oriented x4 Psych:  Alert and cooperative. Normal mood and affect.  ASSESSMENT/PLAN: Aaron Shah is a 68 y.o. male presenting today with history of adenoma and need for surveillance in 5 years, reporting long-standing history of intermittent rectal bleeding and pain. From his symptoms, likely dealing with occult anal fissure and internal hemorrhoids. Will treat with [facility] hemorrhoid cream with Nitro added, and have him return in 6-8 weeks. At that time, will pursue banding. He would be an excellent candidate and has no contraindications to this. Maximize bowel regimen in interim with addition of fiber, limited toilet time, and no straining. Return in 6-8 weeks.   Annitta Needs, PhD, ANP-BC [facility]  Gastroenterology

## 2019-03-28 NOTE — Patient Instructions (Addendum)
I am calling in the special compounded cream to [facility]. They will call you to get your insurance information and discuss pricing.  Add Benefiber 2 teaspoons each day to your diet and increase if tolerated.  Avoid straining, sitting for more than 2-3 minutes at a time on the toilet.  We will see you in 6-8 weeks for a hemorrhoid banding!   It was a pleasure to see you today. I want to create trusting relationships with patients to provide genuine, compassionate, and quality care. I value your feedback. If you receive a survey regarding your visit,  I greatly appreciate you taking time to fill this out.   Annitta Needs, PhD, ANP-BC [facility] Gastroenterology    Hemorrhoids Hemorrhoids are swollen veins in and around the rectum or anus. There are two types of hemorrhoids:  Internal hemorrhoids. These occur in the veins that are just inside the rectum. They may poke through to the outside and become irritated and painful.  External hemorrhoids. These occur in the veins that are outside the anus and can be felt as a painful swelling or hard lump near the anus. Most hemorrhoids do not cause serious problems, and they can be managed with home treatments such as diet and lifestyle changes. If home treatments do not help the symptoms, procedures can be done to shrink or remove the hemorrhoids. What are the causes? This condition is caused by increased pressure in the anal area. This pressure may result from various things, including:  Constipation.  Straining to have a bowel movement.  Diarrhea.  Pregnancy.  Obesity.  Sitting for long periods of time.  Heavy lifting or other activity that causes you to strain.  Anal sex.  Riding a bike for a long period of time. What are the signs or symptoms? Symptoms of this condition include:  Pain.  Anal itching or irritation.  Rectal bleeding.  Leakage of stool (feces).  Anal swelling.  One or more lumps around the  anus. How is this diagnosed? This condition can often be diagnosed through a visual exam. Other exams or tests may also be done, such as:  An exam that involves feeling the rectal area with a gloved hand (digital rectal exam).  An exam of the anal canal that is done using a small tube (anoscope).  A blood test, if you have lost a significant amount of blood.  A test to look inside the colon using a flexible tube with a camera on the end (sigmoidoscopy or colonoscopy). How is this treated? This condition can usually be treated at home. However, various procedures may be done if dietary changes, lifestyle changes, and other home treatments do not help your symptoms. These procedures can help make the hemorrhoids smaller or remove them completely. Some of these procedures involve surgery, and others do not. Common procedures include:  Rubber band ligation. Rubber bands are placed at the base of the hemorrhoids to cut off their blood supply.  Sclerotherapy. Medicine is injected into the hemorrhoids to shrink them.  Infrared coagulation. A type of light energy is used to get rid of the hemorrhoids.  Hemorrhoidectomy surgery. The hemorrhoids are surgically removed, and the veins that supply them are tied off.  Stapled hemorrhoidopexy surgery. The surgeon staples the base of the hemorrhoid to the rectal wall. Follow these instructions at home: Eating and drinking   Eat foods that have a lot of fiber in them, such as whole grains, beans, nuts, fruits, and vegetables.  Ask your health  care provider about taking products that have added fiber (fiber supplements).  Reduce the amount of fat in your diet. You can do this by eating low-fat dairy products, eating less red meat, and avoiding processed foods.  Drink enough fluid to keep your urine pale yellow. Managing pain and swelling   Take warm sitz baths for 20 minutes, 3-4 times a day to ease pain and discomfort. You may do this in a  bathtub or using a portable sitz bath that fits over the toilet.  If directed, apply ice to the affected area. Using ice packs between sitz baths may be helpful. ? Put ice in a plastic bag. ? Place a towel between your skin and the bag. ? Leave the ice on for 20 minutes, 2-3 times a day. General instructions  Take over-the-counter and prescription medicines only as told by your health care provider.  Use medicated creams or suppositories as told.  Get regular exercise. Ask your health care provider how much and what kind of exercise is best for you. In general, you should do moderate exercise for at least 30 minutes on most days of the week (150 minutes each week). This can include activities such as walking, biking, or yoga.  Go to the bathroom when you have the urge to have a bowel movement. Do not wait.  Avoid straining to have bowel movements.  Keep the anal area dry and clean. Use wet toilet paper or moist towelettes after a bowel movement.  Do not sit on the toilet for long periods of time. This increases blood pooling and pain.  Keep all follow-up visits as told by your health care provider. This is important. Contact a health care provider if you have:  Increasing pain and swelling that are not controlled by treatment or medicine.  Difficulty having a bowel movement, or you are unable to have a bowel movement.  Pain or inflammation outside the area of the hemorrhoids. Get help right away if you have:  Uncontrolled bleeding from your rectum. Summary  Hemorrhoids are swollen veins in and around the rectum or anus.  Most hemorrhoids can be managed with home treatments such as diet and lifestyle changes.  Taking warm sitz baths can help ease pain and discomfort.  In severe cases, procedures or surgery can be done to shrink or remove the hemorrhoids. This information is not intended to replace advice given to you by your health care provider. Make sure you discuss any  questions you have with your health care provider. Document Released: 04/22/2000 Document Revised: 05/03/2018 Document Reviewed: 09/14/2017 Elsevier Patient Education  2020 [employer].

## 2019-04-29 ENCOUNTER — Ambulatory Visit: Payer: No Typology Code available for payment source | Attending: Family Medicine

## 2019-04-29 ENCOUNTER — Other Ambulatory Visit: Payer: Self-pay

## 2019-04-29 DIAGNOSIS — Z20822 Contact with and (suspected) exposure to covid-19: Secondary | ICD-10-CM

## 2019-04-30 LAB — NOVEL CORONAVIRUS, NAA: SARS-CoV-2, NAA: DETECTED — AB

## 2019-05-01 ENCOUNTER — Telehealth: Payer: Self-pay | Admitting: Unknown Physician Specialty

## 2019-05-01 ENCOUNTER — Telehealth: Payer: Self-pay | Admitting: Family Medicine

## 2019-05-01 ENCOUNTER — Other Ambulatory Visit: Payer: Self-pay | Admitting: Family Medicine

## 2019-05-01 MED ORDER — PREDNISONE 10 MG PO TABS: ORAL_TABLET | ORAL | 0 refills | Status: DC

## 2019-05-01 MED ORDER — HYDROXYCHLOROQUINE SULFATE 200 MG PO TABS: 400.0000 mg | ORAL_TABLET | Freq: Every day | ORAL | 0 refills | Status: DC

## 2019-05-01 MED ORDER — AZITHROMYCIN 250 MG PO TABS: ORAL_TABLET | ORAL | 0 refills | Status: DC

## 2019-05-01 NOTE — Telephone Encounter (Signed)
Patient aware and verbalized understanding. °

## 2019-05-01 NOTE — Telephone Encounter (Signed)
Please contact the patient I sent in a combination of three medications that should help to [facility]. He also has the option of applying to the infusion clinic at [facility] for an outpt. Infusion that will help. I do not have a preference for him at this time. Whichever he prefers is appropriate. WS

## 2019-05-01 NOTE — Telephone Encounter (Signed)
Discussed with patient about Covid symptoms and the use of bamlanivimab, a monoclonal antibody infusion for those with mild to moderate Covid symptoms and at a high risk of hospitalization.  Pt is feeling better and symptoms greater than 2 weeks.

## 2019-05-01 NOTE — Telephone Encounter (Signed)
Patient has tested positive for COVID19 patient c/o of mucus. Patient is does having breathing issues, because he has asthma. Every once in a while he will have to use his inhaler. FYI

## 2019-05-28 ENCOUNTER — Encounter: Payer: No Typology Code available for payment source | Admitting: Gastroenterology

## 2019-06-23 IMAGING — DX DG CHEST 2V
2 series · 2 of 2 positions shown · non-contrast
Comparison: None.

CLINICAL DATA: 66-year-old male with a history of dizzy

EXAM:
CHEST  2 VIEW

[chest pa]
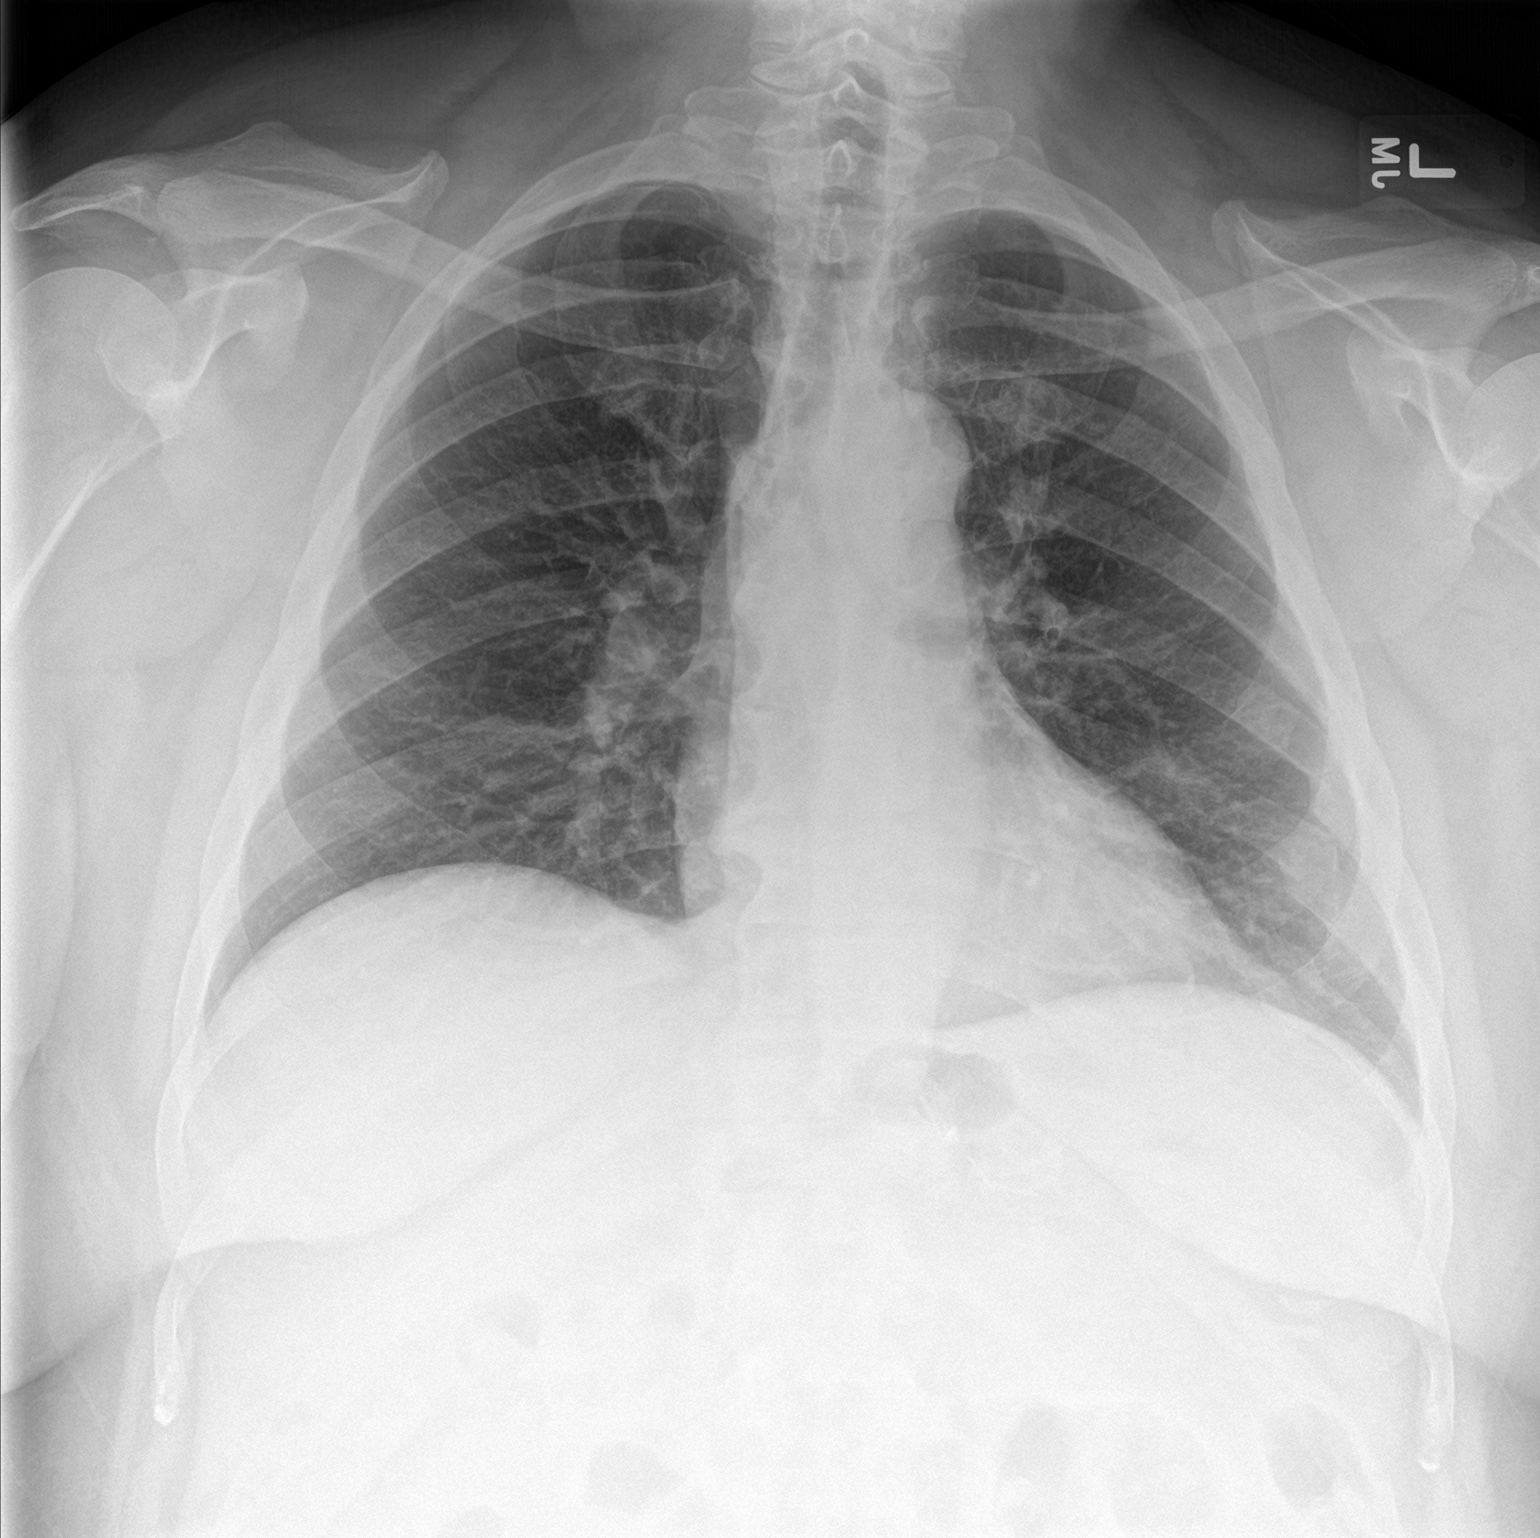

[chest lat]
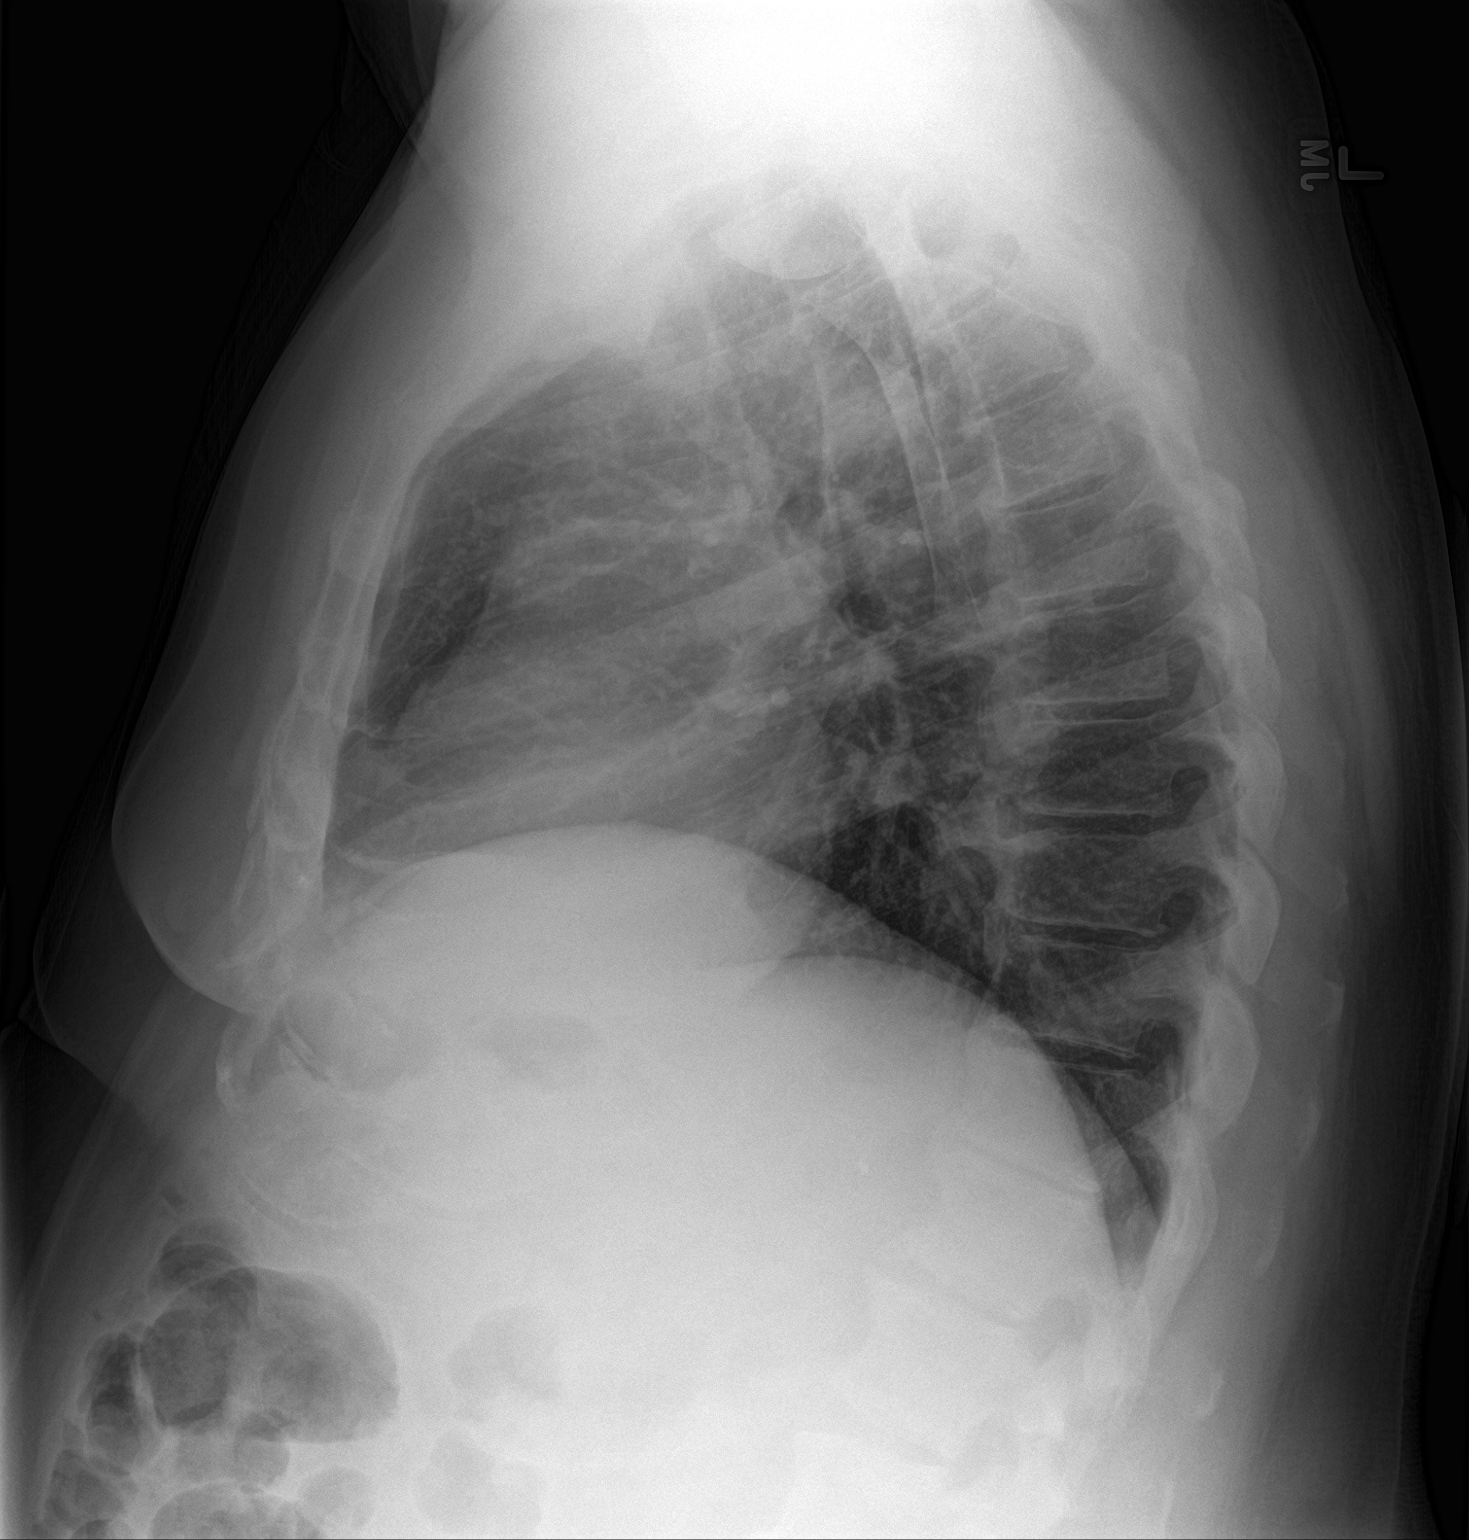

[2 of 2 positions shown; findings below may reference images not displayed]

FINDINGS: The heart size and mediastinal contours are within normal limits.
Both lungs are clear. Degenerative changes of the thoracic spine
IMPRESSION: Negative for acute cardiopulmonary disease

## 2019-11-21 ENCOUNTER — Other Ambulatory Visit: Payer: Self-pay

## 2019-11-21 ENCOUNTER — Ambulatory Visit (INDEPENDENT_AMBULATORY_CARE_PROVIDER_SITE_OTHER): Payer: Medicare Other | Admitting: Family Medicine

## 2019-11-21 ENCOUNTER — Encounter: Payer: Self-pay | Admitting: Family Medicine

## 2019-11-21 VITALS — BP 137/64 | HR 52 | Temp 98.5°F | Resp 20 | Ht 71.0 in | Wt 274.0 lb

## 2019-11-21 DIAGNOSIS — Z125 Encounter for screening for malignant neoplasm of prostate: Secondary | ICD-10-CM

## 2019-11-21 DIAGNOSIS — E559 Vitamin D deficiency, unspecified: Secondary | ICD-10-CM

## 2019-11-21 DIAGNOSIS — I152 Hypertension secondary to endocrine disorders: Secondary | ICD-10-CM

## 2019-11-21 DIAGNOSIS — E291 Testicular hypofunction: Secondary | ICD-10-CM

## 2019-11-21 DIAGNOSIS — I10 Essential (primary) hypertension: Secondary | ICD-10-CM | POA: Diagnosis not present

## 2019-11-21 DIAGNOSIS — E1159 Type 2 diabetes mellitus with other circulatory complications: Secondary | ICD-10-CM

## 2019-11-21 MED ORDER — METOPROLOL SUCCINATE ER 50 MG PO TB24: 50.0000 mg | ORAL_TABLET | Freq: Every day | ORAL | 2 refills | Status: AC

## 2019-11-21 NOTE — Progress Notes (Signed)
Subjective:  Patient ID: Aaron Shah,  male    DOB: 05/03/1951  Age: 69 y.o.    CC: No chief complaint on file.   HPI Aaron Shah presents for  follow-up of hypertension. Patient has no history of headache chest pain or shortness of breath or recent cough. Patient also denies symptoms of TIA such as numbness weakness lateralizing. Patient denies side effects from medication. States taking it regularly.  Patient also  in for follow-up of elevated cholesterol. Doing well without complaints on current medication. Denies side effects  including myalgia and arthralgia and nausea. Also in today for liver function testing. Currently no chest pain, shortness of breath or other cardiovascular related symptoms noted.  Follow-up of diabetes. Patient does check blood sugar at home.  Generally gets his care for his diabetes at the [facility]. Patient denies symptoms such as excessive hunger or urinary frequency, excessive hunger, nausea No significant hypoglycemic spells noted. Medications reviewed. Pt reports taking them regularly. Pt. denies complication/adverse reaction today.    History Aaron Shah has a past medical history of Arthritis, Asthma, Cataract, Concussion (05/2013), Diabetes mellitus without complication ([place]) (4967), H/O exercise stress test (2016), Hypertension, Pneumonia (1970's), and Sleep apnea (2016).   He has a past surgical history that includes Gastric bypass (12/2004); Tonsillectomy; Testicle removal (1960's, 2001); Eye surgery; Hernia repair; Irrigation and debridement sabaceous cyst (Right); Cataract extraction w/PHACO (Left, 08/19/2015); LEFT HEART CATH AND CORONARY ANGIOGRAPHY (N/A, 02/15/2017); Colonoscopy (N/A, 12/26/2018); and polypectomy (12/26/2018).   His family history includes COPD in his father; Cancer in his father, mother, paternal grandfather, and sister; Emphysema in his father.He reports that he has never smoked. He has never used smokeless tobacco. He reports  previous alcohol use. He reports that he does not use drugs.  Current Outpatient Medications on File Prior to Visit  Medication Sig Dispense Refill  . albuterol (PROVENTIL HFA;VENTOLIN HFA) 108 (90 Base) MCG/ACT inhaler Inhale 1 puff into the lungs every 6 (six) hours as needed for wheezing or shortness of breath.     . Ascorbic Acid (VITAMIN C PO) Take 564 mg by mouth daily.    . Cholecalciferol (VITAMIN D3) 50 MCG (2000 UT) TABS Take 2,000 Units by mouth daily.    Marland Kitchen gabapentin (NEURONTIN) 300 MG capsule Take 300 mg by mouth at bedtime as needed (pain.).    Marland Kitchen hydrocortisone (ANUSOL-HC) 2.5 % rectal cream Place 1 application rectally 4 (four) times daily. 30 g 1  . hydrocortisone (ANUSOL-HC) 25 MG suppository Place 1 suppository (25 mg total) rectally 2 (two) times daily. (Patient taking differently: Place 25 mg rectally 2 (two) times daily as needed for hemorrhoids. ) 12 suppository 0  . ibuprofen (ADVIL,MOTRIN) 400 MG tablet Take 1 tablet (400 mg total) by mouth every 6 (six) hours as needed for mild pain or moderate pain. (Patient taking differently: Take 400 mg by mouth every 6 (six) hours as needed for mild pain (for stiffness). ) 30 tablet 2  . lisinopril (PRINIVIL,ZESTRIL) 10 MG tablet Take 10 mg by mouth daily.     . metFORMIN (GLUCOPHAGE) 500 MG tablet Take 500 mg by mouth 2 (two) times daily.     Marland Kitchen testosterone cypionate (DEPOTESTOSTERONE CYPIONATE) 200 MG/ML injection Inject 200 mg into the muscle every 14 (fourteen) days.      No current facility-administered medications on file prior to visit.    ROS Review of Systems  Constitutional: Negative.   HENT: Negative.   Eyes: Negative for visual disturbance.  Respiratory: Negative  for cough and shortness of breath.   Cardiovascular: Negative for chest pain and leg swelling.  Gastrointestinal: Negative for abdominal pain, diarrhea, nausea and vomiting.  Genitourinary: Negative for difficulty urinating.  Musculoskeletal: Negative for  arthralgias and myalgias.  Skin: Negative for rash.  Neurological: Negative for headaches.  Psychiatric/Behavioral: Negative for sleep disturbance.    Objective:  BP 137/64   Pulse (!) 52   Temp 98.5 F (36.9 C) (Temporal)   Resp 20   Ht _0  (1.803 m)   Wt 274 lb (124.3 kg)   SpO2 97%   BMI 38.22 kg/m   BP Readings from Last 3 Encounters:  11/21/19 137/64  03/28/19 (!) 146/67  12/26/18 133/69    Wt Readings from Last 3 Encounters:  11/21/19 274 lb (124.3 kg)  03/28/19 271 lb 6.4 oz (123.1 kg)  12/26/18 270 lb (122.5 kg)     Physical Exam Vitals reviewed.  Constitutional:      Appearance: He is well-developed.  HENT:     Head: Normocephalic and atraumatic.     Right Ear: Tympanic membrane and external ear normal. No decreased hearing noted.     Left Ear: Tympanic membrane and external ear normal. No decreased hearing noted.     Mouth/Throat:     Pharynx: No oropharyngeal exudate or posterior oropharyngeal erythema.  Eyes:     Pupils: Pupils are equal, round, and reactive to light.  Cardiovascular:     Rate and Rhythm: Normal rate and regular rhythm.     Heart sounds: No murmur heard.   Pulmonary:     Effort: No respiratory distress.     Breath sounds: Normal breath sounds.  Abdominal:     General: Bowel sounds are normal.     Palpations: Abdomen is soft. There is no mass.     Tenderness: There is no abdominal tenderness.  Musculoskeletal:     Cervical back: Normal range of motion and neck supple.     Diabetic Foot Exam - Simple   No data filed        Assessment & Plan:   Diagnoses and all orders for this visit:  Hypogonadism in male -     CBC with Differential/Platelet -     CMP14+EGFR -     TSH -     Testosterone,Free and Total  Essential hypertension -     CBC with Differential/Platelet -     CMP14+EGFR -     TSH -     Urinalysis  Hypertension associated with diabetes (HCC) -     Bayer DCA Hb A1c Waived -     CBC with  Differential/Platelet -     CMP14+EGFR -     Lipid panel -     TSH -     Urinalysis  Vitamin D deficiency -     VITAMIN D 25 Hydroxy (Vit-D Deficiency, Fractures)  Screening for prostate cancer -     PSA Total (Reflex To Free)  Other orders -     metoprolol succinate (TOPROL-XL) 50 MG 24 hr tablet; Take 1 tablet (50 mg total) by mouth daily. For blood pressure control   I have discontinued Gwyndolyn Saxon Hollinsworth's hydroxychloroquine, predniSONE, and azithromycin. I am also having him start on metoprolol succinate. Additionally, I am having him maintain his testosterone cypionate, albuterol, hydrocortisone, lisinopril, metFORMIN, ibuprofen, gabapentin, Vitamin D3, Ascorbic Acid (VITAMIN C PO), and hydrocortisone.  Meds ordered this encounter  Medications  . metoprolol succinate (TOPROL-XL) 50 MG 24 hr tablet  Sig: Take 1 tablet (50 mg total) by mouth daily. For blood pressure control    Dispense:  30 tablet    Refill:  2     Follow-up: No follow-ups on file.  Claretta Fraise, M.D.

## 2019-11-22 ENCOUNTER — Other Ambulatory Visit: Payer: Medicare Other

## 2019-11-22 DIAGNOSIS — E559 Vitamin D deficiency, unspecified: Secondary | ICD-10-CM | POA: Diagnosis not present

## 2019-11-22 DIAGNOSIS — I10 Essential (primary) hypertension: Secondary | ICD-10-CM | POA: Diagnosis not present

## 2019-11-22 DIAGNOSIS — E1159 Type 2 diabetes mellitus with other circulatory complications: Secondary | ICD-10-CM | POA: Diagnosis not present

## 2019-11-22 LAB — URINALYSIS
Bilirubin, UA: NEGATIVE
Glucose, UA: NEGATIVE
Ketones, UA: NEGATIVE
Nitrite, UA: NEGATIVE
Protein,UA: NEGATIVE
RBC, UA: NEGATIVE
Specific Gravity, UA: 1.02 (ref 1.005–1.030)
Urobilinogen, Ur: 0.2 mg/dL (ref 0.2–1.0)
pH, UA: 5 (ref 5.0–7.5)

## 2019-11-22 LAB — BAYER DCA HB A1C WAIVED: HB A1C (BAYER DCA - WAIVED): 7.1 % — ABNORMAL HIGH (ref <7.0)

## 2019-11-24 ENCOUNTER — Encounter: Payer: Self-pay | Admitting: Family Medicine

## 2019-11-25 ENCOUNTER — Other Ambulatory Visit: Payer: Self-pay | Admitting: Family Medicine

## 2019-11-25 MED ORDER — VITAMIN D (ERGOCALCIFEROL) 1.25 MG (50000 UNIT) PO CAPS: 50000.0000 [IU] | ORAL_CAPSULE | ORAL | 3 refills | Status: AC

## 2019-11-26 LAB — CBC WITH DIFFERENTIAL/PLATELET
Basophils Absolute: 0.1 10*3/uL (ref 0.0–0.2)
Basos: 1 %
EOS (ABSOLUTE): 0.2 10*3/uL (ref 0.0–0.4)
Eos: 3 %
Hematocrit: 42.3 % (ref 37.5–51.0)
Hemoglobin: 12.9 g/dL — ABNORMAL LOW (ref 13.0–17.7)
Immature Grans (Abs): 0 10*3/uL (ref 0.0–0.1)
Immature Granulocytes: 0 %
Lymphocytes Absolute: 1.5 10*3/uL (ref 0.7–3.1)
Lymphs: 19 %
MCH: 24.6 pg — ABNORMAL LOW (ref 26.6–33.0)
MCHC: 30.5 g/dL — ABNORMAL LOW (ref 31.5–35.7)
MCV: 81 fL (ref 79–97)
Monocytes Absolute: 0.7 10*3/uL (ref 0.1–0.9)
Monocytes: 9 %
Neutrophils Absolute: 5.1 10*3/uL (ref 1.4–7.0)
Neutrophils: 68 %
Platelets: 281 10*3/uL (ref 150–450)
RBC: 5.24 x10E6/uL (ref 4.14–5.80)
RDW: 17.3 % — ABNORMAL HIGH (ref 11.6–15.4)
WBC: 7.5 10*3/uL (ref 3.4–10.8)

## 2019-11-26 LAB — LIPID PANEL
Chol/HDL Ratio: 3.5 ratio (ref 0.0–5.0)
Cholesterol, Total: 156 mg/dL (ref 100–199)
HDL: 45 mg/dL (ref >39)
LDL Chol Calc (NIH): 98 mg/dL (ref 0–99)
Triglycerides: 63 mg/dL (ref 0–149)
VLDL Cholesterol Cal: 13 mg/dL (ref 5–40)

## 2019-11-26 LAB — CMP14+EGFR
ALT: 25 IU/L (ref 0–44)
AST: 20 IU/L (ref 0–40)
Albumin/Globulin Ratio: 1.9 (ref 1.2–2.2)
Albumin: 4.2 g/dL (ref 3.8–4.8)
Alkaline Phosphatase: 79 IU/L (ref 48–121)
BUN/Creatinine Ratio: 17 (ref 10–24)
BUN: 22 mg/dL (ref 8–27)
Bilirubin Total: 0.4 mg/dL (ref 0.0–1.2)
CO2: 20 mmol/L (ref 20–29)
Calcium: 9 mg/dL (ref 8.6–10.2)
Chloride: 105 mmol/L (ref 96–106)
Creatinine, Ser: 1.27 mg/dL (ref 0.76–1.27)
GFR calc Af Amer: 68 mL/min/{1.73_m2} (ref >59)
GFR calc non Af Amer: 58 mL/min/{1.73_m2} — ABNORMAL LOW (ref >59)
Globulin, Total: 2.2 g/dL (ref 1.5–4.5)
Glucose: 174 mg/dL — ABNORMAL HIGH (ref 65–99)
Potassium: 4.9 mmol/L (ref 3.5–5.2)
Sodium: 137 mmol/L (ref 134–144)
Total Protein: 6.4 g/dL (ref 6.0–8.5)

## 2019-11-26 LAB — VITAMIN D 25 HYDROXY (VIT D DEFICIENCY, FRACTURES): Vit D, 25-Hydroxy: 22.1 ng/mL — ABNORMAL LOW (ref 30.0–100.0)

## 2019-11-26 LAB — TESTOSTERONE,FREE AND TOTAL
Testosterone, Free: 6.4 pg/mL — ABNORMAL LOW (ref 6.6–18.1)
Testosterone: 58 ng/dL — ABNORMAL LOW (ref 264–916)

## 2019-11-26 LAB — PSA TOTAL (REFLEX TO FREE): Prostate Specific Ag, Serum: 1.1 ng/mL (ref 0.0–4.0)

## 2019-11-26 LAB — TSH: TSH: 1.3 u[IU]/mL (ref 0.450–4.500)

## 2019-12-20 ENCOUNTER — Ambulatory Visit (INDEPENDENT_AMBULATORY_CARE_PROVIDER_SITE_OTHER): Payer: Medicare Other | Admitting: Family Medicine

## 2019-12-20 ENCOUNTER — Encounter: Payer: Self-pay | Admitting: Family Medicine

## 2019-12-20 DIAGNOSIS — J4521 Mild intermittent asthma with (acute) exacerbation: Secondary | ICD-10-CM

## 2019-12-20 MED ORDER — PREDNISONE 20 MG PO TABS: ORAL_TABLET | ORAL | 0 refills | Status: AC

## 2019-12-20 NOTE — Progress Notes (Signed)
Virtual Visit via telephone Note  I connected with Aaron Shah on 12/20/19 at 1536 by telephone and verified that I am speaking with the correct person using two identifiers. Aaron Shah is currently located at home and no other people are currently with her during visit. The provider, Fransisca Kaufmann Reuven Braver, MD is located in their office at time of visit.  Call ended at 1546  I discussed the limitations, risks, security and privacy concerns of performing an evaluation and management service by telephone and the availability of in person appointments. I also discussed with the patient that there may be a patient responsible charge related to this service. The patient expressed understanding and agreed to proceed.   History and Present Illness: Patient is calling in for complaints of hacky cough that he picked up in [place].  He has a CPAP machine and has a lot of mucus in morning and the evening.  During the day he is fine but fights it at night.  He has been wheezing in the evening. He says the inhalers have been helping. He denies any fevers or chills. He denies sick contacts.  He had covid in [place].  He has mucus every night. When he was in the [employer] service he had a lot of bronchitis, but nothing since.   No diagnosis found.  Outpatient Encounter Medications as of 12/20/2019  Medication Sig  . albuterol (PROVENTIL HFA;VENTOLIN HFA) 108 (90 Base) MCG/ACT inhaler Inhale 1 puff into the lungs every 6 (six) hours as needed for wheezing or shortness of breath.   . Ascorbic Acid (VITAMIN C PO) Take 564 mg by mouth daily.  . Cholecalciferol (VITAMIN D3) 50 MCG (2000 UT) TABS Take 2,000 Units by mouth daily.  Marland Kitchen gabapentin (NEURONTIN) 300 MG capsule Take 300 mg by mouth at bedtime as needed (pain.).  Marland Kitchen hydrocortisone (ANUSOL-HC) 2.5 % rectal cream Place 1 application rectally 4 (four) times daily.  . hydrocortisone (ANUSOL-HC) 25 MG suppository Place 1 suppository (25 mg total) rectally 2  (two) times daily. (Patient taking differently: Place 25 mg rectally 2 (two) times daily as needed for hemorrhoids. )  . ibuprofen (ADVIL,MOTRIN) 400 MG tablet Take 1 tablet (400 mg total) by mouth every 6 (six) hours as needed for mild pain or moderate pain. (Patient taking differently: Take 400 mg by mouth every 6 (six) hours as needed for mild pain (for stiffness). )  . lisinopril (PRINIVIL,ZESTRIL) 10 MG tablet Take 10 mg by mouth daily.   . metFORMIN (GLUCOPHAGE) 500 MG tablet Take 500 mg by mouth 2 (two) times daily.   . metoprolol succinate (TOPROL-XL) 50 MG 24 hr tablet Take 1 tablet (50 mg total) by mouth daily. For blood pressure control  . testosterone cypionate (DEPOTESTOSTERONE CYPIONATE) 200 MG/ML injection Inject 200 mg into the muscle every 14 (fourteen) days.   . Vitamin D, Ergocalciferol, (DRISDOL) 1.25 MG (50000 UNIT) CAPS capsule Take 1 capsule (50,000 Units total) by mouth every 7 (seven) days.   No facility-administered encounter medications on file as of 12/20/2019.    Review of Systems  Constitutional: Negative for chills and fever.  HENT: Positive for congestion, postnasal drip and rhinorrhea. Negative for sinus pressure, sinus pain, sneezing and sore throat.   Eyes: Negative for visual disturbance.  Respiratory: Positive for cough and wheezing. Negative for shortness of breath.   Cardiovascular: Negative for chest pain and leg swelling.  Skin: Negative for rash.  All other systems reviewed and are negative.   Observations/Objective: Patient sounds  comfortable and in no acute distress  Assessment and Plan: Problem List Items Addressed This Visit    None    Visit Diagnoses    Mild intermittent asthma with exacerbation    -  Primary      Will treat with prednisone, may have to double metformin. Follow up plan: Return if symptoms worsen or fail to improve.     I discussed the assessment and treatment plan with the patient. The patient was provided an  opportunity to ask questions and all were answered. The patient agreed with the plan and demonstrated an understanding of the instructions.   The patient was advised to call back or seek an in-person evaluation if the symptoms worsen or if the condition fails to improve as anticipated.  The above assessment and management plan was discussed with the patient. The patient verbalized understanding of and has agreed to the management plan. Patient is aware to call the clinic if symptoms persist or worsen. Patient is aware when to return to the clinic for a follow-up visit. Patient educated on when it is appropriate to go to the emergency department.    I provided 10 minutes of non-face-to-face time during this encounter.    Worthy Rancher, MD

## 2023-06-27 ENCOUNTER — Other Ambulatory Visit (HOSPITAL_BASED_OUTPATIENT_CLINIC_OR_DEPARTMENT_OTHER): Payer: Self-pay

## 2023-11-16 ENCOUNTER — Encounter (INDEPENDENT_AMBULATORY_CARE_PROVIDER_SITE_OTHER): Payer: Self-pay | Admitting: *Deleted
